# Patient Record
Sex: Female | Born: 1940 | Race: White | Hispanic: No | Marital: Married | Smoking: Former smoker
Health system: Southern US, Community
[De-identification: ages and names within clinical notes are randomized; demographics above are authoritative.]

## PROBLEM LIST (undated history)

## (undated) DIAGNOSIS — R0603 Acute respiratory distress: Secondary | ICD-10-CM

## (undated) DIAGNOSIS — I341 Nonrheumatic mitral (valve) prolapse: Secondary | ICD-10-CM

## (undated) DIAGNOSIS — N6019 Diffuse cystic mastopathy of unspecified breast: Secondary | ICD-10-CM

## (undated) DIAGNOSIS — IMO0001 Reserved for inherently not codable concepts without codable children: Secondary | ICD-10-CM

## (undated) DIAGNOSIS — M1990 Unspecified osteoarthritis, unspecified site: Secondary | ICD-10-CM

## (undated) DIAGNOSIS — E7800 Pure hypercholesterolemia, unspecified: Secondary | ICD-10-CM

## (undated) DIAGNOSIS — R001 Bradycardia, unspecified: Secondary | ICD-10-CM

## (undated) DIAGNOSIS — I4510 Unspecified right bundle-branch block: Secondary | ICD-10-CM

## (undated) DIAGNOSIS — G20 Parkinson's disease: Secondary | ICD-10-CM

## (undated) DIAGNOSIS — R1310 Dysphagia, unspecified: Secondary | ICD-10-CM

## (undated) DIAGNOSIS — R030 Elevated blood-pressure reading, without diagnosis of hypertension: Secondary | ICD-10-CM

## (undated) DIAGNOSIS — K219 Gastro-esophageal reflux disease without esophagitis: Secondary | ICD-10-CM

## (undated) DIAGNOSIS — F0390 Unspecified dementia without behavioral disturbance: Secondary | ICD-10-CM

## (undated) HISTORY — PX: DILATION AND CURETTAGE OF UTERUS: SHX78

## (undated) HISTORY — DX: Gastro-esophageal reflux disease without esophagitis: K21.9

## (undated) HISTORY — DX: Reserved for inherently not codable concepts without codable children: IMO0001

## (undated) HISTORY — DX: Nonrheumatic mitral (valve) prolapse: I34.1

## (undated) HISTORY — DX: Parkinson's disease: G20

## (undated) HISTORY — DX: Bradycardia, unspecified: R00.1

## (undated) HISTORY — DX: Dysphagia, unspecified: R13.10

## (undated) HISTORY — DX: Pure hypercholesterolemia, unspecified: E78.00

## (undated) HISTORY — DX: Acute respiratory distress: R06.03

## (undated) HISTORY — DX: Diffuse cystic mastopathy of unspecified breast: N60.19

## (undated) HISTORY — DX: Unspecified right bundle-branch block: I45.10

## (undated) HISTORY — PX: TONSILLECTOMY: SUR1361

## (undated) HISTORY — DX: Elevated blood-pressure reading, without diagnosis of hypertension: R03.0

## (undated) HISTORY — DX: Unspecified osteoarthritis, unspecified site: M19.90

---

## 1998-04-23 ENCOUNTER — Ambulatory Visit (HOSPITAL_COMMUNITY): Admission: RE | Admit: 1998-04-23 | Discharge: 1998-04-23 | Payer: Self-pay | Admitting: Obstetrics & Gynecology

## 1999-12-11 ENCOUNTER — Other Ambulatory Visit: Admission: RE | Admit: 1999-12-11 | Discharge: 1999-12-11 | Payer: Self-pay | Admitting: Obstetrics and Gynecology

## 2000-01-06 HISTORY — PX: COLONOSCOPY: SHX174

## 2000-07-20 ENCOUNTER — Encounter (INDEPENDENT_AMBULATORY_CARE_PROVIDER_SITE_OTHER): Payer: Self-pay | Admitting: Specialist

## 2000-07-20 ENCOUNTER — Ambulatory Visit (HOSPITAL_COMMUNITY): Admission: RE | Admit: 2000-07-20 | Discharge: 2000-07-20 | Payer: Self-pay | Admitting: Obstetrics and Gynecology

## 2000-08-17 ENCOUNTER — Encounter: Admission: RE | Admit: 2000-08-17 | Discharge: 2000-08-17 | Payer: Self-pay | Admitting: Obstetrics and Gynecology

## 2000-08-17 ENCOUNTER — Encounter: Payer: Self-pay | Admitting: Obstetrics and Gynecology

## 2000-12-16 ENCOUNTER — Encounter: Payer: Self-pay | Admitting: Internal Medicine

## 2000-12-21 ENCOUNTER — Other Ambulatory Visit: Admission: RE | Admit: 2000-12-21 | Discharge: 2000-12-21 | Payer: Self-pay | Admitting: Obstetrics and Gynecology

## 2001-12-23 ENCOUNTER — Other Ambulatory Visit: Admission: RE | Admit: 2001-12-23 | Discharge: 2001-12-23 | Payer: Self-pay | Admitting: Obstetrics and Gynecology

## 2002-10-19 ENCOUNTER — Encounter: Payer: Self-pay | Admitting: Internal Medicine

## 2002-12-26 ENCOUNTER — Other Ambulatory Visit: Admission: RE | Admit: 2002-12-26 | Discharge: 2002-12-26 | Payer: Self-pay | Admitting: Obstetrics and Gynecology

## 2003-11-14 ENCOUNTER — Ambulatory Visit: Payer: Self-pay | Admitting: Internal Medicine

## 2004-01-01 ENCOUNTER — Other Ambulatory Visit: Admission: RE | Admit: 2004-01-01 | Discharge: 2004-01-01 | Payer: Self-pay | Admitting: Obstetrics and Gynecology

## 2004-10-24 ENCOUNTER — Ambulatory Visit: Payer: Self-pay | Admitting: Internal Medicine

## 2004-11-06 ENCOUNTER — Encounter: Admission: RE | Admit: 2004-11-06 | Discharge: 2004-11-06 | Payer: Self-pay | Admitting: Obstetrics and Gynecology

## 2004-11-07 ENCOUNTER — Ambulatory Visit: Payer: Self-pay | Admitting: Internal Medicine

## 2005-01-13 ENCOUNTER — Other Ambulatory Visit: Admission: RE | Admit: 2005-01-13 | Discharge: 2005-01-13 | Payer: Self-pay | Admitting: Obstetrics and Gynecology

## 2005-10-07 ENCOUNTER — Ambulatory Visit: Payer: Self-pay | Admitting: Internal Medicine

## 2005-11-03 ENCOUNTER — Ambulatory Visit: Payer: Self-pay | Admitting: Internal Medicine

## 2005-11-11 ENCOUNTER — Ambulatory Visit: Payer: Self-pay | Admitting: Internal Medicine

## 2006-05-12 ENCOUNTER — Ambulatory Visit: Payer: Self-pay | Admitting: Internal Medicine

## 2006-05-12 LAB — CONVERTED CEMR LAB
Cholesterol: 215 mg/dL (ref 0–200)
Direct LDL: 114.4 mg/dL
HDL: 70.4 mg/dL (ref >39.0)
Hgb A1c MFr Bld: 5.1 % (ref 4.6–6.0)
Total CHOL/HDL Ratio: 3.1
Triglycerides: 81 mg/dL (ref 0–149)
VLDL: 16 mg/dL (ref 0–40)

## 2006-05-24 ENCOUNTER — Encounter: Payer: Self-pay | Admitting: Internal Medicine

## 2006-05-24 ENCOUNTER — Ambulatory Visit: Payer: Self-pay | Admitting: Internal Medicine

## 2006-11-15 ENCOUNTER — Telehealth (INDEPENDENT_AMBULATORY_CARE_PROVIDER_SITE_OTHER): Payer: Self-pay | Admitting: *Deleted

## 2007-01-27 ENCOUNTER — Telehealth (INDEPENDENT_AMBULATORY_CARE_PROVIDER_SITE_OTHER): Payer: Self-pay | Admitting: *Deleted

## 2007-01-28 ENCOUNTER — Telehealth (INDEPENDENT_AMBULATORY_CARE_PROVIDER_SITE_OTHER): Payer: Self-pay | Admitting: *Deleted

## 2007-02-02 ENCOUNTER — Encounter: Payer: Self-pay | Admitting: Internal Medicine

## 2007-02-07 ENCOUNTER — Other Ambulatory Visit: Admission: RE | Admit: 2007-02-07 | Discharge: 2007-02-07 | Payer: Self-pay | Admitting: Obstetrics and Gynecology

## 2007-03-03 ENCOUNTER — Encounter: Payer: Self-pay | Admitting: Internal Medicine

## 2007-04-25 ENCOUNTER — Encounter: Payer: Self-pay | Admitting: Internal Medicine

## 2007-04-27 ENCOUNTER — Encounter: Payer: Self-pay | Admitting: Internal Medicine

## 2007-05-06 ENCOUNTER — Encounter (INDEPENDENT_AMBULATORY_CARE_PROVIDER_SITE_OTHER): Payer: Self-pay | Admitting: *Deleted

## 2007-05-23 ENCOUNTER — Telehealth (INDEPENDENT_AMBULATORY_CARE_PROVIDER_SITE_OTHER): Payer: Self-pay | Admitting: *Deleted

## 2007-06-15 ENCOUNTER — Ambulatory Visit: Payer: Self-pay | Admitting: Internal Medicine

## 2007-07-12 ENCOUNTER — Encounter: Payer: Self-pay | Admitting: Internal Medicine

## 2007-09-29 ENCOUNTER — Ambulatory Visit: Payer: Self-pay | Admitting: Internal Medicine

## 2008-02-29 ENCOUNTER — Encounter: Payer: Self-pay | Admitting: Internal Medicine

## 2008-06-14 ENCOUNTER — Ambulatory Visit: Payer: Self-pay | Admitting: Internal Medicine

## 2008-07-02 ENCOUNTER — Encounter: Payer: Self-pay | Admitting: Internal Medicine

## 2008-07-13 ENCOUNTER — Encounter: Payer: Self-pay | Admitting: Internal Medicine

## 2008-10-10 ENCOUNTER — Encounter: Payer: Self-pay | Admitting: Internal Medicine

## 2009-06-11 ENCOUNTER — Encounter: Payer: Self-pay | Admitting: Internal Medicine

## 2009-06-14 ENCOUNTER — Ambulatory Visit: Payer: Self-pay | Admitting: Internal Medicine

## 2009-06-26 ENCOUNTER — Encounter: Payer: Self-pay | Admitting: Internal Medicine

## 2009-07-02 ENCOUNTER — Ambulatory Visit: Payer: Self-pay | Admitting: Internal Medicine

## 2009-07-02 LAB — CONVERTED CEMR LAB
OCCULT 1: NEGATIVE
OCCULT 2: NEGATIVE
OCCULT 3: NEGATIVE

## 2009-07-03 ENCOUNTER — Encounter (INDEPENDENT_AMBULATORY_CARE_PROVIDER_SITE_OTHER): Payer: Self-pay | Admitting: *Deleted

## 2009-07-15 ENCOUNTER — Encounter: Payer: Self-pay | Admitting: Internal Medicine

## 2009-10-28 ENCOUNTER — Encounter: Payer: Self-pay | Admitting: Internal Medicine

## 2010-02-02 LAB — CONVERTED CEMR LAB
ALT: 6 units/L (ref 0–35)
ALT: 6 units/L (ref 0–35)
ALT: 7 units/L (ref 0–35)
AST: 19 units/L (ref 0–37)
AST: 22 units/L (ref 0–37)
AST: 22 units/L (ref 0–37)
Albumin: 3.8 g/dL (ref 3.5–5.2)
Albumin: 3.9 g/dL (ref 3.5–5.2)
Albumin: 4 g/dL (ref 3.5–5.2)
Alkaline Phosphatase: 55 units/L (ref 39–117)
Alkaline Phosphatase: 56 units/L (ref 39–117)
Alkaline Phosphatase: 56 units/L (ref 39–117)
BUN: 20 mg/dL (ref 6–23)
BUN: 21 mg/dL (ref 6–23)
BUN: 21 mg/dL (ref 6–23)
Basophils Absolute: 0 10*3/uL (ref 0.0–0.1)
Basophils Absolute: 0 10*3/uL (ref 0.0–0.1)
Basophils Absolute: 0 10*3/uL (ref 0.0–0.1)
Basophils Relative: 0 % (ref 0.0–1.0)
Basophils Relative: 0.4 % (ref 0.0–3.0)
Basophils Relative: 0.5 % (ref 0.0–3.0)
Bilirubin Urine: NEGATIVE
Bilirubin, Direct: 0.1 mg/dL (ref 0.0–0.3)
Bilirubin, Direct: 0.1 mg/dL (ref 0.0–0.3)
Bilirubin, Direct: 0.1 mg/dL (ref 0.0–0.3)
Blood in Urine, dipstick: NEGATIVE
CO2: 27 meq/L (ref 19–32)
CO2: 28 meq/L (ref 19–32)
CO2: 30 meq/L (ref 19–32)
Calcium: 9.2 mg/dL (ref 8.4–10.5)
Calcium: 9.3 mg/dL (ref 8.4–10.5)
Calcium: 9.7 mg/dL (ref 8.4–10.5)
Chloride: 104 meq/L (ref 96–112)
Chloride: 105 meq/L (ref 96–112)
Chloride: 109 meq/L (ref 96–112)
Cholesterol, target level: 200 mg/dL
Cholesterol: 193 mg/dL (ref 0–200)
Cholesterol: 199 mg/dL (ref 0–200)
Cholesterol: 210 mg/dL — ABNORMAL HIGH (ref 0–200)
Creatinine, Ser: 0.8 mg/dL (ref 0.4–1.2)
Creatinine, Ser: 0.8 mg/dL (ref 0.4–1.2)
Creatinine, Ser: 0.9 mg/dL (ref 0.4–1.2)
Direct LDL: 116.7 mg/dL
Eosinophils Absolute: 0.2 10*3/uL (ref 0.0–0.7)
Eosinophils Absolute: 0.3 10*3/uL (ref 0.0–0.7)
Eosinophils Absolute: 0.6 10*3/uL (ref 0.0–0.7)
Eosinophils Relative: 3.1 % (ref 0.0–5.0)
Eosinophils Relative: 3.9 % (ref 0.0–5.0)
Eosinophils Relative: 8.5 % — ABNORMAL HIGH (ref 0.0–5.0)
GFR calc Af Amer: 92 mL/min
GFR calc non Af Amer: 66.11 mL/min (ref >60)
GFR calc non Af Amer: 75.51 mL/min (ref >60)
GFR calc non Af Amer: 76 mL/min
Glucose, Bld: 86 mg/dL (ref 70–99)
Glucose, Bld: 89 mg/dL (ref 70–99)
Glucose, Bld: 93 mg/dL (ref 70–99)
Glucose, Urine, Semiquant: NEGATIVE
HCT: 37.3 % (ref 36.0–46.0)
HCT: 39.8 % (ref 36.0–46.0)
HCT: 39.8 % (ref 36.0–46.0)
HDL goal, serum: 50 mg/dL
HDL: 62.4 mg/dL (ref >39.0)
HDL: 63.6 mg/dL (ref >39.00)
HDL: 69.6 mg/dL (ref >39.00)
Hemoglobin: 12.8 g/dL (ref 12.0–15.0)
Hemoglobin: 13.5 g/dL (ref 12.0–15.0)
Hemoglobin: 13.8 g/dL (ref 12.0–15.0)
LDL Cholesterol: 113 mg/dL — ABNORMAL HIGH (ref 0–99)
LDL Cholesterol: 114 mg/dL — ABNORMAL HIGH (ref 0–99)
LDL Goal: 120 mg/dL
Lymphocytes Relative: 38.1 % (ref 12.0–46.0)
Lymphocytes Relative: 42.8 % (ref 12.0–46.0)
Lymphocytes Relative: 45 % (ref 12.0–46.0)
Lymphs Abs: 2.8 10*3/uL (ref 0.7–4.0)
Lymphs Abs: 3.6 10*3/uL (ref 0.7–4.0)
MCHC: 34 g/dL (ref 30.0–36.0)
MCHC: 34.2 g/dL (ref 30.0–36.0)
MCHC: 34.8 g/dL (ref 30.0–36.0)
MCV: 94.2 fL (ref 78.0–100.0)
MCV: 95.6 fL (ref 78.0–100.0)
MCV: 97.4 fL (ref 78.0–100.0)
Monocytes Absolute: 0.5 10*3/uL (ref 0.1–1.0)
Monocytes Absolute: 0.6 10*3/uL (ref 0.1–1.0)
Monocytes Absolute: 0.6 10*3/uL (ref 0.1–1.0)
Monocytes Relative: 6.8 % (ref 3.0–12.0)
Monocytes Relative: 8 % (ref 3.0–12.0)
Monocytes Relative: 9.1 % (ref 3.0–12.0)
Neutro Abs: 2.7 10*3/uL (ref 1.4–7.7)
Neutro Abs: 3.3 10*3/uL (ref 1.4–7.7)
Neutro Abs: 3.4 10*3/uL (ref 1.4–7.7)
Neutrophils Relative %: 42.7 % — ABNORMAL LOW (ref 43.0–77.0)
Neutrophils Relative %: 45 % (ref 43.0–77.0)
Neutrophils Relative %: 46.1 % (ref 43.0–77.0)
Nitrite: NEGATIVE
Platelets: 187 10*3/uL (ref 150.0–400.0)
Platelets: 189 10*3/uL (ref 150–400)
Platelets: 200 10*3/uL (ref 150.0–400.0)
Potassium: 4 meq/L (ref 3.5–5.1)
Potassium: 4.3 meq/L (ref 3.5–5.1)
Potassium: 4.7 meq/L (ref 3.5–5.1)
Protein, U semiquant: NEGATIVE
RBC: 3.83 M/uL — ABNORMAL LOW (ref 3.87–5.11)
RBC: 4.17 M/uL (ref 3.87–5.11)
RBC: 4.22 M/uL (ref 3.87–5.11)
RDW: 12.3 % (ref 11.5–14.6)
RDW: 12.4 % (ref 11.5–14.6)
RDW: 13.5 % (ref 11.5–14.6)
Sodium: 138 meq/L (ref 135–145)
Sodium: 141 meq/L (ref 135–145)
Sodium: 142 meq/L (ref 135–145)
Specific Gravity, Urine: 1.02
TSH: 0.5 microintl units/mL (ref 0.35–5.50)
TSH: 0.55 microintl units/mL (ref 0.35–5.50)
TSH: 0.63 microintl units/mL (ref 0.35–5.50)
Total Bilirubin: 1 mg/dL (ref 0.3–1.2)
Total Bilirubin: 1 mg/dL (ref 0.3–1.2)
Total Bilirubin: 1.1 mg/dL (ref 0.3–1.2)
Total CHOL/HDL Ratio: 3
Total CHOL/HDL Ratio: 3
Total CHOL/HDL Ratio: 3.1
Total Protein: 6.4 g/dL (ref 6.0–8.3)
Total Protein: 6.4 g/dL (ref 6.0–8.3)
Total Protein: 6.7 g/dL (ref 6.0–8.3)
Triglycerides: 101 mg/dL (ref 0.0–149.0)
Triglycerides: 105 mg/dL (ref 0.0–149.0)
Triglycerides: 90 mg/dL (ref 0–149)
Urobilinogen, UA: NEGATIVE
VLDL: 18 mg/dL (ref 0–40)
VLDL: 20.2 mg/dL (ref 0.0–40.0)
VLDL: 21 mg/dL (ref 0.0–40.0)
WBC Urine, dipstick: NEGATIVE
WBC: 6.2 10*3/uL (ref 4.5–10.5)
WBC: 7.4 10*3/uL (ref 4.5–10.5)
WBC: 7.8 10*3/uL (ref 4.5–10.5)
pH: 5

## 2010-02-04 NOTE — Letter (Signed)
Summary: [facility]   Imported By: Lanelle Bal 06/27/2009 09:35:45  _____________________________________________________________________  External Attachment:    Type:   Image     Comment:   External Document

## 2010-02-04 NOTE — Assessment & Plan Note (Signed)
Summary: cpx//fd   Vital Signs:  Patient profile:   70 year old female Height:      63.5 inches Weight:      130.6 pounds BMI:     22.85 Temp:     97.7 degrees F oral Pulse rate:   64 / minute Resp:     14 per minute BP sitting:   118 / 70  (left arm) Cuff size:   large  Vitals Entered By: Shonna Chock (June 14, 2009 8:22 AM) CC: CPX with fasting labs , Lipid Management Comments REVIEWED MED LIST, PATIENT AGREED DOSE AND INSTRUCTION CORRECT    CC:  CPX with fasting labs  and Lipid Management.  History of Present Illness: Here for Medicare AWV:  1.   Risk factors based on Past M, S, F history:HTN, Lipids(see Panels),bradycardia, GERD 2.   Physical Activities: see entries 3.   Depression/mood:: minor , intermittent, meds declined 4.   Hearing: none, whisper heard @ 6 feet 5.   ADL's: no limitations despite Parkinson's 6.   Fall Risk: none despite Parkinson's, meds Rxed by Dr Lorin Picket ,[facility] 7.   Home Safety: no risks identified 8.   Height, weight, &visual acuity:vision normal with glasses, seen by Ophth 6 mos ago 9.   Counseling: no acute needs 10.   Labs ordered based on risk factors: 401.9, 995.20,272.4 11.           Referral Coordination: none 12.           Care Plan:Parkinson's  as per Dr Lorin Picket; D/C Beta blocker due to low BP & braycardia. Avoid NSAIDS ASAP due to GERD 13.            Cognitive Assessment: Oriented X3   Lipid Management History:      Positive NCEP/ATP III risk factors include female age 35 years old or older and hypertension.  Negative NCEP/ATP III risk factors include no history of early menopause without estrogen hormone replacement, non-diabetic, HDL cholesterol greater than 60, no family history for ischemic heart disease, non-tobacco-user status, no ASHD (atherosclerotic heart disease), no prior stroke/TIA, no peripheral vascular disease, and no history of aortic aneurysm.     Preventive Screening-Counseling & Management  Alcohol-Tobacco     Alcohol  drinks/day: 1     Smoking Status: quit > 6 months     Year Quit: 1997  Caffeine-Diet-Exercise     Caffeine use/day: 1 cup /day     Diet Comments: none     Does Patient Exercise: yes     Type of exercise: swimming, [employer]     Exercise (avg: min/session): 30-60  Hep-HIV-STD-Contraception     Dental Visit-last 6 months yes     SBE monthly: no     Sun Exposure-Excessive: no  Safety-Violence-Falls     Seat Belt Use: yes     Smoke Detectors: yes     Violence in the Home: no risk noted     Sexual Abuse: no     Fall Risk: Parkinson's      Sexual History:  currently monogamous.        Drug Use:  never.        Blood Transfusions:  no.        Travel History:  none recently.    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 Hypercholestrolemia: LDL goal < 120 as per NMR Lipoprofile & < 160  as per Framingham Study  Parkinson's, Dr Jerold Coombe ,[facility] GERD  Past Surgical History: G3 P2; D&C X1; Tonsillectomy Colonoscopy negative X 2 (last 2009, due 2019)  Family History: Father: CVA,HTN,CHF, asthma; DM 1st cousin Mother: CVA,HTN,breast cancer Siblings: negative  (twin brother)  Social History: No diet  Former Smoker: quit 1997 Alcohol use-yes:socially Regular exercise-yes: Exercise class 2X/week ; swim 3X/week Smoking Status:  quit > 6 months Caffeine use/day:  1 cup /day Dental Care w/in 6 mos.:  yes Sun Exposure-Excessive:  no Seat Belt Use:  yes Fall Risk:  Parkinson's Sexual History:  currently monogamous Drug Use:  never Blood Transfusions:  no  Review of Systems  The patient denies fever, vision loss, decreased hearing, hoarseness, prolonged cough, headaches, hemoptysis, abdominal pain, melena, hematochezia, severe indigestion/heartburn, hematuria, suspicious skin lesions, unusual weight change, abnormal bleeding, enlarged lymph nodes, angioedema, and  breast masses.         Minimally decreased appetite due to Parkinson's & meds. Weight down 4# over  past year. Gyn seen 2011, vitamin D Rxed.Minor incontinence. Occasional sharp pain in legs w/o trigger.OTC Prevacid  as needed  controls dyspepsia.NSAIDS as needed OA pain, up to 4?day. CV:  Denies chest pain or discomfort, difficulty breathing at night, difficulty breathing while lying down, leg cramps with exertion, lightheadness, near fainting, shortness of breath with exertion, swelling of feet, and swelling of hands; Rare palpitations.  Physical Exam  General:  well-nourished,in no acute distress; alert,appropriate and cooperative throughout examination Head:  Normocephalic and atraumatic without obvious abnormalities.  Eyes:  No corneal or conjunctival inflammation noted. Perrla. Funduscopic exam benign, without hemorrhages, exudates or papilledema. Vision grossly normal. Ears:  External ear exam shows no significant lesions or deformities.  Otoscopic examination reveals clear canals, tympanic membranes are intact bilaterally without bulging, retraction, inflammation or discharge. Hearing is grossly normal bilaterally. Nose:  External nasal examination shows no deformity or inflammation. Nasal mucosa are pink and moist without lesions or exudates. Mouth:  Oral mucosa and oropharynx without lesions or exudates.  Teeth in good repair. Neck:  No deformities, masses, or tenderness noted. Breasts:  [place] [facility] Lungs:  Normal respiratory effort, chest expands symmetrically. Lungs are clear to auscultation, no crackles or wheezes. Heart:  regular rhythm, no murmur, no gallop, no rub, no JVD, no HJR,  split S1,physiological split S2, and bradycardia.   Abdomen:  Bowel sounds positive,abdomen soft and non-tender without masses, organomegaly or hernias noted. Genitalia:  [place] [facility] Msk:  No deformity or scoliosis noted of thoracic or lumbar spine.   Pulses:  R and L carotid,radial,dorsalis pedis and posterior  tibial pulses are full and equal bilaterally Extremities:  No clubbing, cyanosis, edema, or deformity noted with normal full range of motion of all joints.   No tremor or cogwheeling Neurologic:  alert & oriented X3, strength normal in all extremities, and DTRs symmetrical and normal except decreased R knee.   Skin:  Intact without suspicious lesions or rashes Cervical Nodes:  No lymphadenopathy noted Axillary Nodes:  No palpable lymphadenopathy Psych:  memory intact for recent and remote, normally interactive, good eye contact, not anxious appearing, and not depressed appearing.     Impression & Recommendations:  Problem # 1:  PREVENTIVE HEALTH CARE (ICD-V70.0)  Orders: Subsequent annual wellness visit with prevention plan (O1308) TLB-CBC Platelet - w/Differential (85025-CBCD)  Problem # 2:  PARKINSON'S DISEASE (ICD-332.0)  as per Dr Lorin Picket  Orders: Venipuncture (65784)  Problem # 3:  HYPERCHOLESTEROLEMIA, PURE (ICD-272.0)  Orders: Venipuncture (69629) TLB-Lipid  Panel (80061-LIPID) TLB-Hepatic/Liver Function Pnl (80076-HEPATIC)  Problem # 4:  BRADYCARDIA (ICD-427.89)  The following medications were removed from the medication list:    Metoprolol Tartrate 25 Mg Tabs (Metoprolol tartrate) .Marland Kitchen... 1 by mouth once daily as needed for bp Her updated medication list for this problem includes:    Aspirin 81 Mg Tbec (Aspirin) .Marland Kitchen... Take one tablet daily  Orders: Venipuncture (60454) TLB-TSH (Thyroid Stimulating Hormone) (84443-TSH) EKG w/ Interpretation (93000)  Problem # 5:  ELEVATED BLOOD PRESSURE WITHOUT DIAGNOSIS OF HYPERTENSION (ICD-796.2)  The following medications were removed from the medication list:    Metoprolol Tartrate 25 Mg Tabs (Metoprolol tartrate) .Marland Kitchen... 1 by mouth once daily as needed for bp  Orders: Venipuncture (09811) TLB-BMP (Basic Metabolic Panel-BMET) (80048-METABOL) EKG w/ Interpretation (93000)  Problem # 6:  GERD (ICD-530.81)  Orders: TLB-CBC  Platelet - w/Differential (85025-CBCD)  Problem # 7:  DEGENERATIVE JOINT DISEASE, GENERALIZED (ICD-715.00)  Her updated medication list for this problem includes:    Aspirin 81 Mg Tbec (Aspirin) .Marland Kitchen... Take one tablet daily  Complete Medication List: 1)  Ropinirole Hcl 1 Mg Tabs (Ropinirole hcl) .... 3 tabs daily 2)  Selegiline Hcl 5 Mg Caps (Selegiline hcl) .... One tablet daily 3)  Sinemet 10-100 Mg Tabs (Carbidopa-levodopa) .... One half tablet twice daily 4)  Omega-3 350 Mg Caps (Omega-3 fatty acids) 5)  Aspirin 81 Mg Tbec (Aspirin) .... Take one tablet daily 6)  Vitamin D (ergocalciferol) 50000 Unit Caps (Ergocalciferol) .Marland Kitchen.. 1 by mouth every other week  Other Orders: UA Dipstick w/o Micro (manual) (91478)  Lipid Assessment/Plan:      Based on NCEP/ATP III, the patient's risk factor category is "0-1 risk factors".  The patient's lipid goals are as follows: Total cholesterol goal is 200; LDL cholesterol goal is 120; HDL cholesterol goal is 50; Triglyceride goal is 150.  Her LDL cholesterol goal has been met.    Patient Instructions: 1)  Stop Metoprolol due to low BP & bradycardia.Check your Blood Pressure regularly. If it is above: 140/90 ON AVERAGE off Metoprolol  you should make an appointment.Take antibiotics before any dental, gastrointestinal, or genitourinary procedures to prevent damage to your heart valves from an infection. Avoid foods high in acid (tomatoes, citrus juices, spicy foods). Avoid eating within two hours of lying down or before exercising. Do not over eat; try smaller more frequent meals. Elevate head of bed twelve inches when sleeping. Avoid NSAIDS as discussed.    Laboratory Results   Urine Tests   Date/Time Reported: June 14, 2009 9:51 AM   Routine Urinalysis   Color: yellow Appearance: Clear Glucose: negative   (Normal Range: Negative) Bilirubin: negative   (Normal Range: Negative) Ketone: smal (15)   (Normal Range: Negative) Spec. Gravity: 1.020    (Normal Range: 1.003-1.035) Blood: negative   (Normal Range: Negative) pH: 5.0   (Normal Range: 5.0-8.0) Protein: negative   (Normal Range: Negative) Urobilinogen: negative   (Normal Range: 0-1) Nitrite: negative   (Normal Range: Negative) Leukocyte Esterace: negative   (Normal Range: Negative)    Comments: Floydene Flock  June 14, 2009 9:51 AM

## 2010-02-04 NOTE — Letter (Signed)
Summary: Results Follow up Letter  [facility] [place]  [address] [place], [place] [address]   Phone: [phone]  Fax: [phone]    07/03/2009 MRN: [id number]  [facility] [place], [place]  [address]  Dear Melissa Pollard,  The following are the results of your recent test(s):  Test         Result    Pap Smear:        Normal _____  Not Normal _____ Comments: ______________________________________________________ Cholesterol: LDL(Bad cholesterol):         Your goal is less than:         HDL (Good cholesterol):       Your goal is more than: Comments:  ______________________________________________________ Mammogram:        Normal _____  Not Normal _____ Comments:  ___________________________________________________________________ Hemoccult:        Normal __X___  Not normal _______ Comments:    _____________________________________________________________________ Other Tests:    We routinely do not discuss normal results over the telephone.  If you desire a copy of the results, or you have any questions about this information we can discuss them at your next office visit.   Sincerely,

## 2010-02-04 NOTE — Miscellaneous (Signed)
Summary: [facility]  [facility]   Imported By: Lanelle Bal 11/06/2009 11:51:49  _____________________________________________________________________  External Attachment:    Type:   Image     Comment:   External Document

## 2010-02-06 ENCOUNTER — Encounter: Payer: Self-pay | Admitting: Internal Medicine

## 2010-03-04 NOTE — Letter (Signed)
Summary: [facility]  [facility]   Imported By: Maryln Gottron 02/27/2010 09:05:16  _____________________________________________________________________  External Attachment:    Type:   Image     Comment:   External Document

## 2010-05-23 NOTE — H&P (Signed)
[facility]  Patient:    Melissa Pollard, Melissa Pollard                        MRN: [id number] Attending:  Esmeralda Arthur, M.D.                         History and Physical  HISTORY:                      This is a 70 year old female, para 3-1-2 who is admitted to the hospital for [facility] hysteroscopy for an abnormal ultrasound.  She had an ultrasound done on July 15, 2000 and was found to have a retroverted uterus with an endometrial line at 22 mm.  She had linear cystic areas and multiple large echo free areas and calcium.  Her right ovary measured 2.3 x 1.5 x 2.1.  The left ovary was 1.7 x 1.3 x 1.1.  I tried a sonohystogram, and I was unable to visualize the cervix.  I could not do a D&C in the office.  She called 24 hours later and stated that she was bleeding heavy, and has continued to have heavy bleeding all weekend.  Her labwork this morning revealed hemoglobin 14.8.  I told her that I was not surprised that she had heavy bleeding with a uterus as full as hers was.  MEDICATIONS:                  She has been taking Ortho-Prefest for approximately seven months.  She came off of it for a while and then started it again.  She takes Viactiv calcium tablets.  ALLERGIES:                    None known.  PAST GYNECOLOGIC HISTORY:     Her last Pap smear was in December 2001 and was normal.  HABITS:                       She uses caffeine.  She socially has alcohol. She exercises regularly.  REVIEW OF SYSTEMS:            Basically negative.  She has had a slight weight gain.  She does have some stress urinary incontinence and urgency.  She has arthritis she treats.  FAMILY HISTORY:               Her mother had breast cancer.  There is no history of colon or ovarian cancer.  No history of heart disease, strokes or diabetes in the family.  PHYSICAL EXAMINATION:  GENERAL:                      Well-developed, well-nourished female, alert and oriented.  She is  somewhat anxious about heavy bleeding.  VITAL SIGNS:                  Blood pressure 110/70.  Weight 155 today. Height 5 ft 5 in.  NECK:                         Thyroid not palpable.  LUNGS:                        Clear to auscultation and percussion.  HEART:  Normal sinus rhythm.  BREASTS;                      Type 4 without masses.  ABDOMEN:                      Liver and spleen not palpated.  PELVIC:                       External genitalia negative.  Her cervix is not visualized.  Her uterus is posterior.  I was unable to bring the cervix anterior.  The uterus feels slightly enlarged.  On ultrasound, it is 9.6 x 5.1 x 6.5.  Adnexa reveal no masses.  She is having heavy bleeding.  IMPRESSION:                   1. Menorrhagia.                               2. Heavy uterine bleeding.                               3. Postmenopausal.                               4. Abnormal endometrial echos on ultrasound.                               5. Hormone replacement therapy off and on.  DISPOSITION:                  She is admitted for [facility] and hysteroscopy. DD:  07/19/00 TD:  07/19/00 Job: [id number]

## 2010-05-23 NOTE — Op Note (Signed)
[facility]  Patient:    Melissa Pollard, Melissa Pollard                      MRN: [id number] Proc. Date: 07/22/00 Adm. Date:  [id number] Attending:  Amanda Cockayne                           Operative Report  PREOPERATIVE DIAGNOSIS:         Postmenopausal bleeding, heavy.  POSTOPERATIVE DIAGNOSIS:        Endometriosis cast of the uterus and blood clot passed.  Curettement and perforation of the uterus posterior with passage of resectoscope.  OPERATION:  SURGEON:  Darci Needle, M.D.  DESCRIPTION OF PROCEDURE:       The patient was carried to the operating room. After satisfactory general anesthesia, the patient was placed in the lithotomy position, prepped and draped in a sterile field.  The bladder was emptied with catheterization which was very full.  The uterus felt mid posterior.  You could feel something in the vagina.  A weighted speculum was placed in the posterior vagina and it looked like a large clot in the cervix and we removed this with the Randall stone grasping forceps.  It look like a complete cast to the uterus.  This was sent to pathology.  The cervix was grasped with Christella Hartigan tenaculum and a 35 Hanks dilator had no resistance.  We then used the hysteroscope and looked into the uterus and washed the blood out.  She had no bleeding now and she had no large polyps, no fibroid.  We withdrew the scope and did a curettement with a moderate amount of tissue obtained.  We reinserted the scope and she had a lot of glandular tissue posterior and I decided to resect this to see what it looked like.  We did and got a nice piece.  We looked again and we had perforated the uterus.  We immediately stopped and checked her for bleeding.  She had none.  The procedure was terminated after observing she had no bleeding.  The patient was carried to the recovery room in good condition.  The deficit was 0 cc. DD:  07/20/00 TD:  07/21/00 Job:  [id number]

## 2010-06-14 ENCOUNTER — Encounter: Payer: Self-pay | Admitting: Internal Medicine

## 2010-06-16 ENCOUNTER — Ambulatory Visit (INDEPENDENT_AMBULATORY_CARE_PROVIDER_SITE_OTHER): Payer: Medicare Other | Admitting: Internal Medicine

## 2010-06-16 ENCOUNTER — Encounter: Payer: Self-pay | Admitting: Internal Medicine

## 2010-06-16 VITALS — BP 122/78 | HR 60 | Temp 98.1°F | Ht 63.25 in | Wt 119.0 lb

## 2010-06-16 DIAGNOSIS — Z136 Encounter for screening for cardiovascular disorders: Secondary | ICD-10-CM

## 2010-06-16 DIAGNOSIS — Z0000 Encounter for general adult medical examination without abnormal findings: Secondary | ICD-10-CM

## 2010-06-16 DIAGNOSIS — E7800 Pure hypercholesterolemia, unspecified: Secondary | ICD-10-CM

## 2010-06-16 DIAGNOSIS — G20A1 Parkinson's disease without dyskinesia, without mention of fluctuations: Secondary | ICD-10-CM

## 2010-06-16 DIAGNOSIS — F411 Generalized anxiety disorder: Secondary | ICD-10-CM

## 2010-06-16 DIAGNOSIS — G20 Parkinson's disease: Secondary | ICD-10-CM

## 2010-06-16 DIAGNOSIS — R030 Elevated blood-pressure reading, without diagnosis of hypertension: Secondary | ICD-10-CM

## 2010-06-16 LAB — BASIC METABOLIC PANEL
BUN: 21 mg/dL (ref 6–23)
Calcium: 9.5 mg/dL (ref 8.4–10.5)
Creatinine, Ser: 1.1 mg/dL (ref 0.4–1.2)
GFR: 53.83 mL/min — ABNORMAL LOW (ref >60.00)
Glucose, Bld: 94 mg/dL (ref 70–99)
Sodium: 139 mEq/L (ref 135–145)

## 2010-06-16 LAB — HEPATIC FUNCTION PANEL
ALT: 8 U/L (ref 0–35)
AST: 24 U/L (ref 0–37)
Alkaline Phosphatase: 59 U/L (ref 39–117)
Bilirubin, Direct: 0.2 mg/dL (ref 0.0–0.3)
Total Bilirubin: 1.2 mg/dL (ref 0.3–1.2)

## 2010-06-16 MED ORDER — SERTRALINE HCL 25 MG PO TABS: 25.0000 mg | ORAL_TABLET | Freq: Every day | ORAL | Status: DC

## 2010-06-16 NOTE — Progress Notes (Signed)
Subjective:    Patient ID: Melissa Pollard, female    DOB: January 31, 1940, 70 y.o.   MRN: [id number]  HPI Medicare Wellness Visit:  The following psychosocial & medical history were reviewed as required by Medicare.   Social history: caffeine: 1.5 cups/ day , alcohol:  1 wine ,  tobacco use : quit 1997 & exercise : 5X/ week as swimming .   Home & personal  safety / fall risk: Parkinson's , activities of daily living: no limitations , seatbelt use : yes , and smoke alarm employment : yes .  Power of Attorney/Living Will status : inplace  Vision ( as recorded per Nurse) & Hearing  evaluation :  Whisper heard  @ 6 ft; wall chart read @ 6 ft with lenses. Orientation :oriented x 3 , memory & recall :2 of 3, spelling or math testing:fair,and mood & affect :  normal . Depression / anxiety: both intermittently; sleeping poorly. Meds changed by Dr Lorin Picket Travel history : 2007 [place] , immunization status :up to date , transfusion history:  no, and preventive health surveillance ( colonoscopies, BMD , etc as per protocol/ [facility]): up to date, Dental care:  every 6 mos . Chart reviewed &  Updated. Active issues reviewed & addressed.       Review of Systems Anxiety:worse in past 4 weeks; she lost her twin brother who was an alcoholic & ? bipolar Depression:some Loss of interest (Anhedonia):no Panic attacks:no Insomnia:both with onset & maintenance of sleep Anorexia:no Fatigue:some Neurologic signs/symptoms: Headache, numbness and tingling, weakness:no Endocrinologic signs and symptoms: Hoarseness, significant weight change, vision change, temperature intolerance, bowel changes, skin/hair,/nail changes:no Family history of mental health issues, alcoholism or drug abuse:brother; father alcoholic Medications/efficacy:none to date        Objective:   Physical Exam Gen.: Healthy and well-nourished in appearance. Alert, appropriate and cooperative throughout exam. Subtle Parkinsonian facial changes Head:  Normocephalic without obvious abnormalities  Eyes: No corneal or conjunctival inflammation noted. ENT exam unremarkable. Neck: No deformities, masses, or tenderness noted. Range of motion &. Thyroid normal. Lungs: Normal respiratory effort; chest expands symmetrically. Lungs are clear to auscultation without rales, wheezes, or increased work of breathing. Heart: Normal rate and rhythm. Normal S1 and S2. No gallop, click, or rub. Grade 1 raspy R base  murmur. Abdomen: Bowel sounds normal; abdomen soft and nontender. No masses, organomegaly or hernias noted. Genitalia: as per Gyn.                                                                                      Musculoskeletal/extremities: asymmetry  noted of  the thoracic muscles. No clubbing, cyanosis, edema, or deformity noted. Range of motion  normal .Tone & strength  normal.Joints normal. Nail health  good. Vascular: Carotid, radial artery, dorsalis pedis and dorsalis posterior tibial pulses are full and equal. No bruits present. Neurologic: Alert and oriented x3. Deep tendon reflexes symmetrical and normal.          Skin: Intact without suspicious lesions or rashes. Lymph: No cervical, axillary, or inguinal lymphadenopathy present. Psych: Mood and affect are clinically  normal. Normally interactive  Assessment & Plan:  #1 Medicare Wellness Exam; criteria met ; data entered #2 Problem List reviewed ; Assessment/ Recommendations made #3 Anxiety/ Depression, situational Plan: see Orders

## 2010-06-16 NOTE — Assessment & Plan Note (Signed)
Plan: LDL  Goal = < 130.

## 2010-06-16 NOTE — Patient Instructions (Signed)
Preventive Health Care: Exercise  30-45  minutes a day, 3-4 days a week. Walking is especially valuable in preventing Osteoporosis. Eat a low-fat diet with lots of fruits and vegetables, up to 7-9 servings per day. Avoid obesity; your goal = waist less than 35 inches.Consume less than 30 grams of sugar per day from foods & drinks with High Fructose Corn Syrup as #2,3 or #4 on label. Assess response to Sertraline 25 mg daily.

## 2010-06-17 ENCOUNTER — Encounter: Payer: Self-pay | Admitting: Internal Medicine

## 2010-06-17 NOTE — Assessment & Plan Note (Signed)
As per Dr Jerold Coombe, [facility]

## 2010-07-04 ENCOUNTER — Telehealth: Payer: Self-pay | Admitting: *Deleted

## 2010-07-04 DIAGNOSIS — F411 Generalized anxiety disorder: Secondary | ICD-10-CM

## 2010-07-04 NOTE — Telephone Encounter (Signed)
This most  likely as this is a very tiny dose. The usual dose is 50 to 100 mg daily. Please increase the dose to 25 mg 2 pills taken once daily. At next refill the dosage can be changed to 50 mg.

## 2010-07-04 NOTE — Telephone Encounter (Signed)
Discuss with patient  

## 2010-07-04 NOTE — Telephone Encounter (Signed)
Pt called this morning and states that she feels that the Zoloft 25 mg is not helping her, she would like to increase her dosing. Please advise.

## 2010-07-08 ENCOUNTER — Other Ambulatory Visit: Payer: Self-pay | Admitting: Internal Medicine

## 2010-07-08 MED ORDER — SERTRALINE HCL 50 MG PO TABS: 50.0000 mg | ORAL_TABLET | Freq: Every day | ORAL | Status: DC

## 2010-07-08 NOTE — Telephone Encounter (Signed)
Patient needs refill for zoloft  50 mg [facility] [address]  [place] - see note [phone] for dose increase

## 2010-07-08 NOTE — Telephone Encounter (Signed)
Rx sent to pharmacy   

## 2010-07-28 ENCOUNTER — Encounter: Payer: Self-pay | Admitting: Internal Medicine

## 2010-10-15 ENCOUNTER — Ambulatory Visit (INDEPENDENT_AMBULATORY_CARE_PROVIDER_SITE_OTHER): Payer: Medicare Other

## 2010-10-15 DIAGNOSIS — Z23 Encounter for immunization: Secondary | ICD-10-CM

## 2011-02-02 ENCOUNTER — Ambulatory Visit (INDEPENDENT_AMBULATORY_CARE_PROVIDER_SITE_OTHER): Payer: Medicare Other | Admitting: Internal Medicine

## 2011-02-02 VITALS — BP 122/78 | HR 52 | Temp 98.1°F | Wt 113.2 lb

## 2011-02-02 DIAGNOSIS — F411 Generalized anxiety disorder: Secondary | ICD-10-CM

## 2011-02-02 DIAGNOSIS — I499 Cardiac arrhythmia, unspecified: Secondary | ICD-10-CM

## 2011-02-02 DIAGNOSIS — F419 Anxiety disorder, unspecified: Secondary | ICD-10-CM

## 2011-02-02 MED ORDER — SERTRALINE HCL 50 MG PO TABS: ORAL_TABLET | ORAL | Status: DC

## 2011-02-02 NOTE — Patient Instructions (Signed)
To prevent palpitations or premature beats, avoid stimulants such as decongestants, diet pills, nicotine, or caffeine (coffee, tea, cola, or chocolate) to excess.   

## 2011-02-02 NOTE — Progress Notes (Signed)
  Subjective:    Patient ID: Melissa Pollard, female    DOB: 17-Mar-1940, 71 y.o.   MRN: [id number]  HPI Mental Health Symptoms: Onset:significant for years Anxiety:"50% " improved with increased dose of Sertraline Depression:some ; "down in am" Loss of interest (Anhedonia):yes Panic attacks:slightly Insomnia:no Anorexia:poor Fatigue:some Neurologic signs/symptoms: No new  headache, numbness and tingling, weakness Endocrinologic signs and symptoms: Some hoarseness. No recent weight change or vision change. Intolerant to  temperature changes. No bowel changes, skin or nail changes. ? Hair loss       Review of Systems   She has situationally related palpitations; she denies chest pain     Objective:   Physical Exam  Gen.: Thin but well-nourished; in no acute distress Eyes: Extraocular motion intact; no lid lag or proptosis Neck : slight decrease in neck flexion; Thyroid normal Heart:irregular  rhythm and slow rate without significant murmur, gallop. Lungs: Chest clear to auscultation without rales,rales, wheezes Neuro:Deep tendon reflexes are equal but decreased @knees ; R hand tremor  Skin: Warm and dry without significant lesions or rashes; no onycholysis Psych: Normally communicative and interactive; no abnormal mood or affect clinically.         Assessment & Plan:    #1 Her anxiety has improved with an increased dose obstruction leading from 25 mg to 50 mg. This is still low dose. This will be increased to 50 mg 1-1/2 pills daily as a trial.  #2 she is having cardiac irregularity clinically; EKG will be completed. The EKG in June/12 revealed only right bundle branch block. Electrolytes and thyroid function will also be checked.  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 She has no premature beats despite the auscultory  findings on exam.

## 2011-06-10 ENCOUNTER — Encounter: Payer: Self-pay | Admitting: Internal Medicine

## 2011-06-10 ENCOUNTER — Ambulatory Visit (INDEPENDENT_AMBULATORY_CARE_PROVIDER_SITE_OTHER): Payer: Medicare Other | Admitting: Internal Medicine

## 2011-06-10 VITALS — BP 124/84 | HR 66 | Temp 98.2°F | Wt 108.0 lb

## 2011-06-10 DIAGNOSIS — R109 Unspecified abdominal pain: Secondary | ICD-10-CM

## 2011-06-10 DIAGNOSIS — R1084 Generalized abdominal pain: Secondary | ICD-10-CM

## 2011-06-10 DIAGNOSIS — R142 Eructation: Secondary | ICD-10-CM

## 2011-06-10 DIAGNOSIS — N390 Urinary tract infection, site not specified: Secondary | ICD-10-CM

## 2011-06-10 DIAGNOSIS — R634 Abnormal weight loss: Secondary | ICD-10-CM

## 2011-06-10 DIAGNOSIS — R141 Gas pain: Secondary | ICD-10-CM

## 2011-06-10 DIAGNOSIS — R140 Abdominal distension (gaseous): Secondary | ICD-10-CM

## 2011-06-10 LAB — POCT URINALYSIS DIPSTICK
Bilirubin, UA: NEGATIVE
Blood, UA: NEGATIVE
Glucose, UA: NEGATIVE
Ketones, UA: NEGATIVE
Nitrite, UA: NEGATIVE
Spec Grav, UA: >=1.03

## 2011-06-10 MED ORDER — HYOSCYAMINE SULFATE 0.125 MG SL SUBL: 0.1250 mg | SUBLINGUAL_TABLET | SUBLINGUAL | Status: DC | PRN

## 2011-06-10 NOTE — Patient Instructions (Signed)
Please complete stool cards Please try to go on My Chart within the next 24 hours to allow me to release the results directly to you.  

## 2011-06-10 NOTE — Progress Notes (Signed)
Addended by: Silvio Pate D on: 06/10/2011 04:17 PM   Modules accepted: Orders

## 2011-06-10 NOTE — Progress Notes (Signed)
  Subjective:    Patient ID: Melissa Pollard, female    DOB: October 24, 1940, 70 y.o.   MRN: [id number]  HPI ABDOMINAL PAIN: Location: diffuse  Onset: 10 years ago but worse in past 30 days with bloating   Radiation: no  Severity: doubles her over Quality: cramping Duration:intermittently  all day but worse in am  Better with: no relievers tried  Worse with: no      Past medical history/family history/social history were all reviewed and updated. Pertinent data: There is no personal or family history of ulcers, colitis, colon polyps, or colon cancer        Review of Systems Nausea/Vomiting: only nausea Diarrhea: no Constipation: no Melena/BRBPR: frank melena Hematemesis: no Anorexia: yes Fever/Chills: no Dysuria/ hematuria/pyuria:no  Rash: no  Wt loss: 25 # over 2 years EtOH use: occasionally  NSAIDs/ASA:occasionally Vaginal bleeding: no; she's had a gynecologic exam within the last year. As noted bloating began within the last 30 days      Objective:   Physical Exam  General appearance :thin & in  good health and nourishment w/o distress. Parkinsonian neuromuscular activity  Eyes: No conjunctival inflammation or scleral icterus is present.  Oral exam: Dental hygiene is good; lips and gums are healthy appearing.There is no oropharyngeal erythema or exudate noted.   Heart:  Normal rate and regular rhythm. S1 and S2 normal without gallop, murmur, or rub .   Loud mitral click   Lungs:Chest clear to auscultation; no wheezes, rhonchi,rales ,or rubs present.No increased work of breathing.   Abdomen: bowel sounds normal, soft and non-tender without masses, organomegaly or hernias noted.  No guarding or rebound . No flank tenderness to percussion  Skin:Warm & dry.  Intact without suspicious lesions or rashes ; no jaundice or tenting  Lymphatic: No lymphadenopathy is noted about the head, neck, axilla areas.   Vascular: All pulses intact without bruits or deficits              Assessment & Plan:  #1 abdominal pain, diffuse. Acute exacerbation the past 3 days associated with bloating.  #2 significant weight loss in the past 24 months.  Plan: See orders and recommendations

## 2011-06-11 ENCOUNTER — Emergency Department (HOSPITAL_COMMUNITY): Payer: Medicare Other

## 2011-06-11 ENCOUNTER — Encounter (HOSPITAL_COMMUNITY): Payer: Self-pay | Admitting: *Deleted

## 2011-06-11 ENCOUNTER — Emergency Department (HOSPITAL_COMMUNITY)
Admission: EM | Admit: 2011-06-11 | Discharge: 2011-06-11 | Disposition: A | Discharge status: Home / Self Care | Payer: Medicare Other | Attending: Internal Medicine | Admitting: Internal Medicine

## 2011-06-11 DIAGNOSIS — R079 Chest pain, unspecified: Secondary | ICD-10-CM | POA: Insufficient documentation

## 2011-06-11 DIAGNOSIS — F0390 Unspecified dementia without behavioral disturbance: Secondary | ICD-10-CM

## 2011-06-11 DIAGNOSIS — G20 Parkinson's disease: Secondary | ICD-10-CM

## 2011-06-11 DIAGNOSIS — I10 Essential (primary) hypertension: Secondary | ICD-10-CM

## 2011-06-11 DIAGNOSIS — K5900 Constipation, unspecified: Secondary | ICD-10-CM

## 2011-06-11 DIAGNOSIS — R0600 Dyspnea, unspecified: Secondary | ICD-10-CM

## 2011-06-11 DIAGNOSIS — R0602 Shortness of breath: Secondary | ICD-10-CM | POA: Insufficient documentation

## 2011-06-11 DIAGNOSIS — R0989 Other specified symptoms and signs involving the circulatory and respiratory systems: Secondary | ICD-10-CM | POA: Insufficient documentation

## 2011-06-11 DIAGNOSIS — R110 Nausea: Secondary | ICD-10-CM

## 2011-06-11 DIAGNOSIS — R4182 Altered mental status, unspecified: Secondary | ICD-10-CM

## 2011-06-11 DIAGNOSIS — R011 Cardiac murmur, unspecified: Secondary | ICD-10-CM | POA: Insufficient documentation

## 2011-06-11 DIAGNOSIS — G319 Degenerative disease of nervous system, unspecified: Secondary | ICD-10-CM | POA: Insufficient documentation

## 2011-06-11 DIAGNOSIS — F411 Generalized anxiety disorder: Secondary | ICD-10-CM

## 2011-06-11 DIAGNOSIS — Z79899 Other long term (current) drug therapy: Secondary | ICD-10-CM | POA: Insufficient documentation

## 2011-06-11 DIAGNOSIS — G20A1 Parkinson's disease without dyskinesia, without mention of fluctuations: Secondary | ICD-10-CM

## 2011-06-11 DIAGNOSIS — R0609 Other forms of dyspnea: Secondary | ICD-10-CM | POA: Insufficient documentation

## 2011-06-11 LAB — CBC WITH DIFFERENTIAL/PLATELET
Eosinophils Relative: 2.6 % (ref 0.0–5.0)
HCT: 37.1 % (ref 36.0–46.0)
Monocytes Relative: 9.2 % (ref 3.0–12.0)
Neutrophils Relative %: 51.4 % (ref 43.0–77.0)
Platelets: 159 10*3/uL (ref 150.0–400.0)
RBC: 3.88 Mil/uL (ref 3.87–5.11)
WBC: 7.4 10*3/uL (ref 4.5–10.5)

## 2011-06-11 LAB — URINALYSIS, ROUTINE W REFLEX MICROSCOPIC
Glucose, UA: NEGATIVE mg/dL
Hgb urine dipstick: NEGATIVE
Protein, ur: NEGATIVE mg/dL
Specific Gravity, Urine: 1.016 (ref 1.005–1.030)
pH: 7 (ref 5.0–8.0)

## 2011-06-11 LAB — COMPREHENSIVE METABOLIC PANEL
ALT: 12 U/L (ref 0–35)
AST: 21 U/L (ref 0–37)
Calcium: 10.3 mg/dL (ref 8.4–10.5)
Creatinine, Ser: 0.91 mg/dL (ref 0.50–1.10)
GFR calc Af Amer: 72 mL/min — ABNORMAL LOW (ref >90)
Glucose, Bld: 107 mg/dL — ABNORMAL HIGH (ref 70–99)
Sodium: 139 mEq/L (ref 135–145)
Total Protein: 6.7 g/dL (ref 6.0–8.3)

## 2011-06-11 LAB — DIFFERENTIAL
Basophils Relative: 0 % (ref 0–1)
Lymphs Abs: 2.8 10*3/uL (ref 0.7–4.0)
Monocytes Absolute: 0.4 10*3/uL (ref 0.1–1.0)
Monocytes Relative: 6 % (ref 3–12)
Neutro Abs: 3.2 10*3/uL (ref 1.7–7.7)
Neutrophils Relative %: 49 % (ref 43–77)

## 2011-06-11 LAB — CBC
HCT: 39.7 % (ref 36.0–46.0)
Hemoglobin: 13.2 g/dL (ref 12.0–15.0)
MCHC: 33.2 g/dL (ref 30.0–36.0)
RBC: 4.19 MIL/uL (ref 3.87–5.11)

## 2011-06-11 LAB — CARDIAC PANEL(CRET KIN+CKTOT+MB+TROPI)
CK, MB: 3.1 ng/mL (ref 0.3–4.0)
Relative Index: INVALID (ref 0.0–2.5)
Total CK: 66 U/L (ref 7–177)

## 2011-06-11 LAB — HEPATIC FUNCTION PANEL
ALT: 6 U/L (ref 0–35)
AST: 21 U/L (ref 0–37)
Alkaline Phosphatase: 60 U/L (ref 39–117)
Bilirubin, Direct: 0.1 mg/dL (ref 0.0–0.3)
Total Bilirubin: 0.8 mg/dL (ref 0.3–1.2)

## 2011-06-11 LAB — URINE MICROSCOPIC-ADD ON

## 2011-06-11 LAB — URINE CULTURE: Organism ID, Bacteria: NO GROWTH

## 2011-06-11 MED ORDER — CARBIDOPA-LEVODOPA 10-100 MG PO TABS
1.0000 | ORAL_TABLET | Freq: Three times a day (TID) | ORAL | Status: DC
Start: 2011-06-11 — End: 2011-06-11
  Administered 2011-06-11 (×2): 1 via ORAL
  Filled 2011-06-11 (×4): qty 1

## 2011-06-11 MED ORDER — ROPINIROLE HCL 1 MG PO TABS
1.0000 mg | ORAL_TABLET | Freq: Three times a day (TID) | ORAL | Status: DC
  Administered 2011-06-11 (×2): 1 mg via ORAL
  Filled 2011-06-11 (×4): qty 1

## 2011-06-11 MED ORDER — ONDANSETRON HCL 4 MG/2ML IJ SOLN
INTRAMUSCULAR | Status: AC
  Administered 2011-06-11: 06:00:00 via INTRAVENOUS
  Filled 2011-06-11: qty 2

## 2011-06-11 MED ORDER — PROMETHAZINE HCL 25 MG/ML IJ SOLN
12.5000 mg | Freq: Once | INTRAMUSCULAR | Status: AC
  Administered 2011-06-11: 12.5 mg via INTRAVENOUS
  Filled 2011-06-11: qty 1

## 2011-06-11 MED ORDER — ONDANSETRON HCL 4 MG/2ML IJ SOLN
4.0000 mg | Freq: Once | INTRAMUSCULAR | Status: AC
  Administered 2011-06-11: 4 mg via INTRAVENOUS
  Filled 2011-06-11: qty 2

## 2011-06-11 MED ORDER — AMLODIPINE BESYLATE 10 MG PO TABS
10.0000 mg | ORAL_TABLET | Freq: Every day | ORAL | Status: DC
  Administered 2011-06-11: 10 mg via ORAL
  Filled 2011-06-11: qty 1

## 2011-06-11 NOTE — ED Notes (Signed)
Pt woke up at 0300 SOB, and anxious. In and out of bed several times. No previous hx: of Afib.

## 2011-06-11 NOTE — ED Notes (Signed)
Admitting Doctor called and stated will be seeing patient shortly approx 20 minutes.  Notified patient and husband verbalized understanding.

## 2011-06-11 NOTE — ED Notes (Signed)
Spoke with Pharmacy will be sending medication shortly.

## 2011-06-11 NOTE — ED Notes (Signed)
Neurology stated ok to discharge spoke with Dr Jomarie Longs ordered patient to discharge given two medications Amlodipine and miralax.

## 2011-06-11 NOTE — ED Provider Notes (Signed)
History     CSN: [id number]  Arrival date & time 06/11/11  0603   First MD Initiated Contact with Patient 06/11/11 0615      Chief Complaint  Patient presents with  . Shortness of Breath  . Atrial Fibrillation    (Consider location/radiation/quality/duration/timing/severity/associated sxs/prior treatment) HPI 71 year old female presents emergency department with shortness of breath, anxiety and "agitation". Patient reports she was at her baseline yesterday, saw her primary care Dr., Dr. Alwyn Ren for some ongoing nausea and abdominal pain. She woke up this morning around 3 AM with the need to urinate. After going to the bathroom, she was unable to get back to sleep due to nausea, shortness of breath, the feeling of tenseness across her body. She denies chest pain she has not had vomiting. Patient denies previous history of similar episodes. No fevers no chills.  Past Medical History  Diagnosis Date  . MVP (mitral valve prolapse) 2002    MR ,TR. SBE prophylaxis    Past Surgical History  Procedure Date  . Dilation and curettage of uterus   . Tonsillectomy   . Colonoscopy 2002    Negative    Family History  Problem Relation Age of Onset  . Stroke Father   . Hypertension Father   . Heart failure Father   . Asthma Father   . Diabetes Cousin   . Stroke Mother   . Hypertension Mother   . Breast cancer Mother 86    History  Substance Use Topics  . Smoking status: Former Smoker    Quit date: 01/06/1995  . Smokeless tobacco: Not on file  . Alcohol Use: Yes     occasionally    OB History    Grav Para Term Preterm Abortions TAB SAB Ect Mult Living                  Review of Systems  All other systems reviewed and are negative.  other than listed in HPI  Allergies  Review of patient's allergies indicates no known allergies.  Home Medications   Current Outpatient Rx  Name Route Sig Dispense Refill  . CARBIDOPA-LEVODOPA 10-100 MG PO TABS Oral Take 1 tablet by  mouth 3 (three) times daily.      Marland Kitchen ROPINIROLE HCL 1 MG PO TABS Oral Take 1 mg by mouth 3 (three) times daily.      . SERTRALINE HCL 50 MG PO TABS  50 mg. 2 by mouth daily    . ONDANSETRON HCL 4 MG/5ML PO SOLN Oral Take 4 mg by mouth once.      BP 174/100  Pulse 80  Temp(Src) 98.5 F (36.9 C) (Oral)  Resp 10  SpO2 100%  Physical Exam  Nursing note and vitals reviewed. Constitutional: She is oriented to person, place, and time.       Parkinson facial pattern  HENT:  Head: Normocephalic and atraumatic.  Nose: Nose normal.  Mouth/Throat: Oropharynx is clear and moist. No oropharyngeal exudate.       White substance around mouth  Eyes: Conjunctivae and EOM are normal. Pupils are equal, round, and reactive to light.  Neck: Normal range of motion. Neck supple. No JVD present. No tracheal deviation present. No thyromegaly present.  Cardiovascular: Intact distal pulses.  Exam reveals no gallop and no friction rub.   Murmur heard.      Irregular rate  Pulmonary/Chest: Effort normal and breath sounds normal. No respiratory distress. She has no wheezes. She has no rales. She exhibits no  tenderness.  Abdominal: Soft. Bowel sounds are normal. She exhibits no distension and no mass. There is no tenderness. There is no rebound and no guarding.  Musculoskeletal: She exhibits no edema and no tenderness.  Lymphadenopathy:    She has no cervical adenopathy.  Neurological: She is alert and oriented to person, place, and time. No cranial nerve deficit. She exhibits abnormal muscle tone. Coordination normal.       Intermittent twitching in parkinson pattern  Skin: Skin is warm and dry. No rash noted. No erythema. No pallor.  Psychiatric: She has a normal mood and affect. Her behavior is normal. Judgment and thought content normal.    ED Course  Procedures (including critical care time)  Labs Reviewed  COMPREHENSIVE METABOLIC PANEL - Abnormal; Notable for the following:    Glucose, Bld 107 (*)      GFR calc non Af Amer 62 (*)    GFR calc Af Amer 72 (*)    All other components within normal limits  CARDIAC PANEL(CRET KIN+CKTOT+MB+TROPI)  CBC  DIFFERENTIAL  D-DIMER, QUANTITATIVE  URINALYSIS, ROUTINE W REFLEX MICROSCOPIC   Dg Chest 2 View  06/11/2011  *RADIOLOGY REPORT*  Clinical Data: Chest pain and shortness of breath.  CHEST - 2 VIEW  Comparison: None.  Findings: The lungs are clear.  Heart size is normal.  No pneumothorax or pleural fluid.  Severe convex left thoracolumbar scoliosis is noted.  IMPRESSION: No acute disease.  Scoliosis.  Original Report Authenticated By: Bernadene Bell. Maricela Curet, M.D.     Date: 06/11/2011  Rate: 76  Rhythm: premature atrial contractions (PAC) and sinus rhythm  QRS Axis: left  Intervals: frequent pacs  ST/T Wave abnormalities: normal  Conduction Disutrbances:right bundle branch block  Narrative Interpretation: pacs are new  Old EKG Reviewed: changes noted    1. Hypertension   2. Altered mental status   3. Dyspnea   4. Nausea   5. Parkinson's disease       MDM   71 yo female with episode of sob, anxiety, new frequent pacs on ekg.  Will check baseline labs, chest xray.       8:36 AM Discuss case with the patient's primary care provider, Dr. Alwyn Ren. He saw her in the office yesterday, and reports that she had a normal mentation at that time. He was concerned as she's had increased loss of weight, ongoing abdominal pain which has worsened over the last month and bloating. Patient had positive leukocyte esterase on a dipstick in the lab. He reports a culture is pending. He did not start antibiotics. He had ordered an ultrasound for potential ovarian cancer. He reports that she was not confused during their visit. I then spoke with the patient's husband who reports over the last year she has had increasing confusion that has worsened over the last 3 months. Patient was seen by her neurologist at [facility] on Tuesday, who was concerned and wanted her  to have a full workup given her loss of weight. Husband reports she is usually confused but improves after her morning medications. He reports she woke him up in the middle of night saying that she was having a heart attack complaining of chest pain shortness of breath. He attributed her symptoms to anxiety, but reports when he took her blood pressure her systolic was over 200.  Patient is a very poor historian and does appear to have some baseline dementia. Given potential urinary tract infection this could densely worsen her confusion. D-dimer is negative, PACs are  still present and patient's blood pressure has normalized, although she did arrive quite hypertensive. Will discuss with hospitalist admission given the unclear picture at this time for monitoring for chest pain, confusion, and uti.  Olivia Mackie, MD 06/11/11 [phone]

## 2011-06-11 NOTE — ED Notes (Signed)
Admit Doctor at bedside.  

## 2011-06-11 NOTE — ED Notes (Signed)
Husband at bedside.  States Patient intermittently confused because of parkinson's.

## 2011-06-11 NOTE — ED Notes (Addendum)
Pt arrived via [facility] from home c/o weakness and SOB. Pt has been progressively been getting worse for last month, but last 24 hrs has worse. Pt hx: of parkinson's.

## 2011-06-11 NOTE — ED Notes (Signed)
Paged [facility] for admission orders.

## 2011-06-11 NOTE — Consult Note (Signed)
[facility] CONSULT NOTE     Reason for Consult: confusion    HPI:    Melissa Pollard is an 71 y.o. female with known parkinsons disease. She recently was seen by her neurologist at [facility] last tuesday who did not change any of her medications. Her husband has noticed over the past 9 months she has had a decrease in her memory.  She has had  A difficult time following along with movies and forgetting different restaurants they have eaten at. Last night patient woke up and felt very anxious and her blood pressure was elevated.  Patient was brought to ED for further evaluation.   Past Medical History  Diagnosis Date  . MVP (mitral valve prolapse) 2002    MR ,TR. SBE prophylaxis    Past Surgical History  Procedure Date  . Dilation and curettage of uterus   . Tonsillectomy   . Colonoscopy 2002    Negative    Family History  Problem Relation Age of Onset  . Stroke Father   . Hypertension Father   . Heart failure Father   . Asthma Father   . Diabetes Cousin   . Stroke Mother   . Hypertension Mother   . Breast cancer Mother 39    Social History:  reports that she quit smoking about 16 years ago. She does not have any smokeless tobacco history on file. She reports that she drinks alcohol. She reports that she does not use illicit drugs.  No Known Allergies  Medications:    Prior to Admission:  (Not in a hospital admission) Scheduled:   . amLODipine  10 mg Oral Daily  . carbidopa-levodopa  1 tablet Oral TID  . ondansetron      . ondansetron  4 mg Intravenous Once  . promethazine  12.5 mg Intravenous Once  . rOPINIRole  1 mg Oral TID    Review of Systems - General ROS: negative for - chills, fatigue, fever or hot flashes Hematological and Lymphatic ROS: negative for - bruising, fatigue, jaundice or pallor Endocrine ROS: negative for - hair pattern changes, hot flashes, mood swings or skin changes Respiratory ROS: negative for - cough,  hemoptysis, orthopnea or wheezing Cardiovascular ROS: negative for - dyspnea on exertion, orthopnea, palpitations or shortness of breath Gastrointestinal ROS: negative for - abdominal pain, appetite loss, blood in stools, diarrhea or hematemesis Musculoskeletal ROS: negative for - joint pain, joint stiffness, joint swelling or muscle pain Neurological ROS: positive for - memory loss Dermatological ROS: negative for dry skin, pruritus and rash   Blood pressure 141/81, pulse 78, temperature 98.5 F (36.9 C), temperature source Oral, resp. rate 16, SpO2 98.00%.   Neurologic Examination:   Mental Status: Alert, oriented, thought content appropriate.  Speech fluent without evidence of aphasia. Able to follow 3 step commands without difficulty. Cranial Nerves: II-Visual fields grossly intact. III/IV/VI-Extraocular movements intact.  Pupils reactive bilaterally. V/VII-Smile symmetric with masked facies and minimal smile.  VIII-grossly intact IX/X-normal gag XI-bilateral shoulder shrug XII-midline tongue extension Motor: 5/5 bilaterally with slightly increased normal tone through out. Sensory: Pinprick and light touch intact throughout, bilaterally Deep Tendon Reflexes: 2+ bilateral UE and 1+ bilateral LE symmetric. Plantars downgoing bilaterally Cerebellar: Normal finger-to-nose,  normal heel-to-shin test.     Lab Results  Component Value Date/Time   CHOL 217* 06/16/2010 10:56 AM    Results for orders placed  during the hospital encounter of 06/11/11 (from the past 48 hour(s))  CARDIAC PANEL(CRET KIN+CKTOT+MB+TROPI)     Status: Normal   Collection Time   06/11/11  6:31 AM      Component Value Range Comment   Total CK 66  7 - 177 (U/L)    CK, MB 3.1  0.3 - 4.0 (ng/mL)    Troponin I <0.30  <0.30 (ng/mL)    Relative Index RELATIVE INDEX IS INVALID  0.0 - 2.5    COMPREHENSIVE METABOLIC PANEL     Status: Abnormal   Collection Time   06/11/11  6:32 AM      Component Value Range Comment    Sodium 139  135 - 145 (mEq/L)    Potassium 4.2  3.5 - 5.1 (mEq/L)    Chloride 103  96 - 112 (mEq/L)    CO2 26  19 - 32 (mEq/L)    Glucose, Bld 107 (*) 70 - 99 (mg/dL)    BUN 21  6 - 23 (mg/dL)    Creatinine, Ser 1.61  0.50 - 1.10 (mg/dL)    Calcium 09.6  8.4 - 10.5 (mg/dL)    Total Protein 6.7  6.0 - 8.3 (g/dL)    Albumin 3.9  3.5 - 5.2 (g/dL)    AST 21  0 - 37 (U/L)    ALT 12  0 - 35 (U/L)    Alkaline Phosphatase 69  39 - 117 (U/L)    Total Bilirubin 0.4  0.3 - 1.2 (mg/dL)    GFR calc non Af Amer 62 (*) >90 (mL/min)    GFR calc Af Amer 72 (*) >90 (mL/min)   CBC     Status: Normal   Collection Time   06/11/11  6:32 AM      Component Value Range Comment   WBC 6.5  4.0 - 10.5 (K/uL)    RBC 4.19  3.87 - 5.11 (MIL/uL)    Hemoglobin 13.2  12.0 - 15.0 (g/dL)    HCT 04.5  40.9 - 81.1 (%)    MCV 94.7  78.0 - 100.0 (fL)    MCH 31.5  26.0 - 34.0 (pg)    MCHC 33.2  30.0 - 36.0 (g/dL)    RDW 91.4  78.2 - 95.6 (%)    Platelets 157  150 - 400 (K/uL)   DIFFERENTIAL     Status: Normal   Collection Time   06/11/11  6:32 AM      Component Value Range Comment   Neutrophils Relative 49  43 - 77 (%)    Neutro Abs 3.2  1.7 - 7.7 (K/uL)    Lymphocytes Relative 44  12 - 46 (%)    Lymphs Abs 2.8  0.7 - 4.0 (K/uL)    Monocytes Relative 6  3 - 12 (%)    Monocytes Absolute 0.4  0.1 - 1.0 (K/uL)    Eosinophils Relative 2  0 - 5 (%)    Eosinophils Absolute 0.1  0.0 - 0.7 (K/uL)    Basophils Relative 0  0 - 1 (%)    Basophils Absolute 0.0  0.0 - 0.1 (K/uL)   D-DIMER, QUANTITATIVE     Status: Normal   Collection Time   06/11/11  7:49 AM      Component Value Range Comment   D-Dimer, Quant 0.38  0.00 - 0.48 (ug/mL-FEU)   URINALYSIS, ROUTINE W REFLEX MICROSCOPIC     Status: Abnormal   Collection Time   06/11/11  8:33 AM  Component Value Range Comment   Color, Urine YELLOW  YELLOW     APPearance CLOUDY (*) CLEAR     Specific Gravity, Urine 1.016  1.005 - 1.030     pH 7.0  5.0 - 8.0     Glucose, UA  NEGATIVE  NEGATIVE (mg/dL)    Hgb urine dipstick NEGATIVE  NEGATIVE     Bilirubin Urine NEGATIVE  NEGATIVE     Ketones, ur NEGATIVE  NEGATIVE (mg/dL)    Protein, ur NEGATIVE  NEGATIVE (mg/dL)    Urobilinogen, UA 0.2  0.0 - 1.0 (mg/dL)    Nitrite NEGATIVE  NEGATIVE     Leukocytes, UA SMALL (*) NEGATIVE    URINE MICROSCOPIC-ADD ON     Status: Normal   Collection Time   06/11/11  8:33 AM      Component Value Range Comment   Squamous Epithelial / LPF RARE  RARE     WBC, UA 0-2  <3 (WBC/hpf)    Bacteria, UA RARE  RARE      Dg Chest 2 View  06/11/2011  *RADIOLOGY REPORT*  Clinical Data: Chest pain and shortness of breath.  CHEST - 2 VIEW  Comparison: None.  Findings: The lungs are clear.  Heart size is normal.  No pneumothorax or pleural fluid.  Severe convex left thoracolumbar scoliosis is noted.  IMPRESSION: No acute disease.  Scoliosis.  Original Report Authenticated By: Bernadene Bell. Maricela Curet, M.D.   Ct Head Wo Contrast  06/11/2011  *RADIOLOGY REPORT*  Clinical Data: Confusion.  CT HEAD WITHOUT CONTRAST  Technique:  Contiguous axial images were obtained from the base of the skull through the vertex without contrast.  Comparison: None.  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  No mass lesion, mass effect, midline shift, hydrocephalus, or hemorrhage.  No ischemia/infarct identified. Paranasal sinuses are within normal limits.  IMPRESSION: Cerebral atrophy.  Otherwise negative CT head.  Original Report Authenticated By: Andreas Newport, M.D.     Assessment/Plan:   71 YO female with known parkinson's disease and 9 month decline in memory.  Memory decline likely due to progression of parkinson's.  Has seen her outpatient neurologist a couple of days ago.  Is well aware of her condition and is following closely.  Would recommend patient follow up with out patient neurologist as scheduled.       Felicie Morn PA-C [facility] [phone]  06/11/2011, 2:47 PM    Patient seen and examined. I agree with the above.  Thana Farr, MD [facility] [phone]  06/11/2011  3:27 PM

## 2011-06-11 NOTE — ED Notes (Signed)
Neurology at bedside. Patient ate entire food tray without incident.

## 2011-06-11 NOTE — ED Notes (Signed)
Patient resting comfortably on stretcher warm blanket provided.  States she has parkinson's for 10 years.

## 2011-06-11 NOTE — ED Notes (Signed)
Admit Doctor states plan consult neurology in ED and potentially discharge patient with husband.

## 2011-06-17 ENCOUNTER — Encounter: Payer: Medicare Other | Admitting: Internal Medicine

## 2011-06-23 ENCOUNTER — Ambulatory Visit (HOSPITAL_BASED_OUTPATIENT_CLINIC_OR_DEPARTMENT_OTHER)
Admission: RE | Admit: 2011-06-23 | Discharge: 2011-06-23 | Disposition: A | Discharge status: Home / Self Care | Payer: Medicare Other | Source: Ambulatory Visit | Attending: Internal Medicine | Admitting: Internal Medicine

## 2011-06-23 DIAGNOSIS — R143 Flatulence: Secondary | ICD-10-CM | POA: Insufficient documentation

## 2011-06-23 DIAGNOSIS — R141 Gas pain: Secondary | ICD-10-CM | POA: Insufficient documentation

## 2011-06-23 DIAGNOSIS — K8020 Calculus of gallbladder without cholecystitis without obstruction: Secondary | ICD-10-CM | POA: Insufficient documentation

## 2011-06-23 DIAGNOSIS — R142 Eructation: Secondary | ICD-10-CM | POA: Insufficient documentation

## 2011-06-23 DIAGNOSIS — R634 Abnormal weight loss: Secondary | ICD-10-CM | POA: Insufficient documentation

## 2011-06-23 DIAGNOSIS — R1084 Generalized abdominal pain: Secondary | ICD-10-CM

## 2011-06-23 DIAGNOSIS — R140 Abdominal distension (gaseous): Secondary | ICD-10-CM

## 2011-06-23 DIAGNOSIS — R109 Unspecified abdominal pain: Secondary | ICD-10-CM | POA: Insufficient documentation

## 2011-07-20 ENCOUNTER — Telehealth: Payer: Self-pay | Admitting: *Deleted

## 2011-07-20 DIAGNOSIS — R140 Abdominal distension (gaseous): Secondary | ICD-10-CM

## 2011-07-20 DIAGNOSIS — R110 Nausea: Secondary | ICD-10-CM

## 2011-07-20 NOTE — Telephone Encounter (Signed)
Received call from pt husband to advise pt has not received results from imaging 06-10-11, noted pt has parkinsons and gave verbal to speak with husband about results after verifying DOB, advised next OV to sign a release for the pt results to be given to him, pt understood. Advised the results were released via MY chart on 06-24-11 per noted imaging on 06-10-11, noted results as follows:  Entered by Pecola Lawless, MD at 06/24/2011 3:28 PM The liver cyst is of no concern but followup in the considered on an annual basis. Gallstones are present but there is no sign of inflammation around the gallbladder to suggest acute inflammation or passage of a stone. Gastroenterology referral recommended if symptoms persist or progress. Please report any change in signs or symptoms. Thank you for using My Chart; it has served [place] well. Hopp       MD Alwyn Ren made aware and advise pt needs referral for GI, this nurse advised referral rep that the pt sxs have returned and pt/husband noted as unable to log into MY chart nor understand the concept, however did advise husband that the status is listed as active, gave pt husband number to contact MY Chart to recall how to access and walk pt/husband through. Pt husband understood, placed referral in chart for GI, advised pt husband that someone will call her with apt asap per MD Alwyn Ren noted the same sxs will need to be accessed by GI per same advise via MY chart results, pt husband understood, advised referral rep to place rush on referral per pt/husband did not get results and sxs have returned. All parties advised verbal understanding.

## 2011-07-21 NOTE — Telephone Encounter (Signed)
[id number] pt did have an apt to GI today 07-21-11

## 2011-08-17 ENCOUNTER — Encounter: Payer: Self-pay | Admitting: Internal Medicine

## 2011-10-02 ENCOUNTER — Ambulatory Visit (INDEPENDENT_AMBULATORY_CARE_PROVIDER_SITE_OTHER): Payer: Medicare Other | Admitting: Family Medicine

## 2011-10-02 ENCOUNTER — Encounter: Payer: Self-pay | Admitting: Family Medicine

## 2011-10-02 ENCOUNTER — Telehealth: Payer: Self-pay | Admitting: Internal Medicine

## 2011-10-02 VITALS — BP 130/78 | HR 90 | Temp 98.1°F | Ht 61.0 in | Wt 102.0 lb

## 2011-10-02 DIAGNOSIS — W19XXXA Unspecified fall, initial encounter: Secondary | ICD-10-CM

## 2011-10-02 DIAGNOSIS — M25559 Pain in unspecified hip: Secondary | ICD-10-CM

## 2011-10-02 MED ORDER — HYDROCODONE-ACETAMINOPHEN 5-500 MG PO TABS: 1.0000 | ORAL_TABLET | Freq: Three times a day (TID) | ORAL | Status: DC | PRN

## 2011-10-02 NOTE — Telephone Encounter (Signed)
Caller: [name]/Patient; Patient Name: Melissa Pollard; PCP: Marga Melnick; Best Callback Phone Number: [phone].  Patient calling about fall which occurred 09/28/11.  Seemed to be fine initially, but onset of increasing pain and decreased mobility over past 24 hours.  Fell and hit right hip.  She was able to get up but notes spasms now when she moves.  Able to bear weight on that leg, but there is pain.  Per hip injury protocol, advised appointment within 4 hours; appointment scheduled 1430 10/02/11 with Dr. Beverely Low.

## 2011-10-02 NOTE — Progress Notes (Signed)
  Subjective:    Patient ID: Melissa Pollard, female    DOB: [birth year], 71 y.o.   MRN: [id number]  HPI Larey Seat 3 days ago.  Had mild pain in R hip after fall but this has progressively worsened to the point of constant pain.  Pain improves w/ holding pressure.  Pain is sharp w/ walking and weight bearing.  Trouble w/ stairs.  Reports she is able to rest comfortably at night.  No swelling, redness, or visible bruising.   Review of Systems For ROS see HPI     Objective:   Physical Exam  Vitals reviewed. Constitutional: She is oriented to person, place, and time.       Thin, frail appearing Severe parkinsonian tremor  Musculoskeletal: She exhibits tenderness (over R greater trochanter). She exhibits no edema.       No bruising or redness of R hip + TTP over R greater trochanter Good flexion and extension of hip, limited internal/external rotation due to Parkinson's rigidity but no pain  Neurological: She is alert and oriented to person, place, and time. Coordination (severe, uncontrollable parkinsonian tremors) abnormal.          Assessment & Plan:

## 2011-10-02 NOTE — Telephone Encounter (Signed)
Noted, patient to be seen today by Dr.Tabori. Per Dr.Hopper's protocol if pending appointment schedule ok to close encounter

## 2011-10-02 NOTE — Patient Instructions (Signed)
Go to the [employer] [facility]- turn R on [address], [address] on [address], and [address] on [address] Go to the [employer] on the 1st floor- we'll call you with your results Use the vicodin for pain Ice or heat for pain relief Hang in there!!!

## 2011-10-03 ENCOUNTER — Encounter: Payer: Self-pay | Admitting: Family Medicine

## 2011-10-03 NOTE — Assessment & Plan Note (Signed)
New.  Suspect deep bruising but due to tenderness over bony prominence will get xray to r/o fx.  Start pain meds.  Reviewed supportive care and red flags that should prompt return.  Pt expressed understanding and is in agreement w/ plan.

## 2011-10-03 NOTE — Assessment & Plan Note (Signed)
New.  Pt unable to relay circumstances surrounding fall.  Suspect it is due to her severe Parkinson's disease.

## 2011-10-05 ENCOUNTER — Ambulatory Visit (HOSPITAL_BASED_OUTPATIENT_CLINIC_OR_DEPARTMENT_OTHER)
Admission: RE | Admit: 2011-10-05 | Discharge: 2011-10-05 | Disposition: A | Discharge status: Home / Self Care | Payer: Medicare Other | Source: Ambulatory Visit | Attending: Family Medicine | Admitting: Family Medicine

## 2011-10-05 DIAGNOSIS — W19XXXA Unspecified fall, initial encounter: Secondary | ICD-10-CM

## 2011-10-05 DIAGNOSIS — M25559 Pain in unspecified hip: Secondary | ICD-10-CM | POA: Insufficient documentation

## 2011-12-31 ENCOUNTER — Telehealth: Payer: Self-pay | Admitting: Internal Medicine

## 2011-12-31 NOTE — Telephone Encounter (Signed)
Noted, will forward to MD as a FYI  

## 2011-12-31 NOTE — Telephone Encounter (Signed)
This patient's spouse called stating the pt is moving into [facility]. He just wanted Dr. Alwyn Ren to know. Call home # if more information desired.

## 2012-01-05 ENCOUNTER — Ambulatory Visit (INDEPENDENT_AMBULATORY_CARE_PROVIDER_SITE_OTHER): Payer: Medicare Other

## 2012-01-05 DIAGNOSIS — Z0000 Encounter for general adult medical examination without abnormal findings: Secondary | ICD-10-CM

## 2012-01-07 ENCOUNTER — Encounter: Payer: Self-pay | Admitting: *Deleted

## 2012-01-07 LAB — TB SKIN TEST
Induration: 0 mm
TB Skin Test: NEGATIVE

## 2012-01-26 ENCOUNTER — Encounter: Payer: Self-pay | Admitting: Internal Medicine

## 2012-02-20 ENCOUNTER — Other Ambulatory Visit: Payer: Self-pay

## 2012-05-05 ENCOUNTER — Other Ambulatory Visit (HOSPITAL_COMMUNITY): Payer: Self-pay | Admitting: Internal Medicine

## 2012-05-05 DIAGNOSIS — R569 Unspecified convulsions: Secondary | ICD-10-CM

## 2012-05-12 ENCOUNTER — Ambulatory Visit (HOSPITAL_COMMUNITY)
Admission: RE | Admit: 2012-05-12 | Discharge: 2012-05-12 | Disposition: A | Discharge status: Home / Self Care | Payer: Medicare Other | Source: Ambulatory Visit | Attending: Internal Medicine | Admitting: Internal Medicine

## 2012-05-12 DIAGNOSIS — R569 Unspecified convulsions: Secondary | ICD-10-CM | POA: Insufficient documentation

## 2012-05-12 NOTE — Progress Notes (Signed)
EEG Completed; Results Pending  

## 2012-05-13 NOTE — Procedures (Signed)
HISTORY:  A 72 year old female with episodes of altered awareness.  MEDICATIONS:  Unknown.  CONDITIONS OF RECORDING:  This is a 16-channel EEG carried out with the patient in the awake, drowsy, and asleep states.  DESCRIPTION:  During what was felt to be wakefulness, the posterior background rhythm consisted of mostly theta.  It was poorly organized and low voltage.  This was continuous and was seen not only during what was felt to be wakefulness, but also during drowse as well.  The patient also goes into a light sleep during the tracing, in which there was noted to be further slowing of the background rhythm with superimposed symmetrical sleep spindles and vertex central sharp transients. Hyperventilation and intermittent photic stimulation were not performed.  IMPRESSION:  This EEG is characterized by slowing despite the state of the patient.  Although this may be consistent with drowse, cannot rule out the possibility of a more diffuse disturbance that is etiologically nonspecific, but may be related to a metabolic encephalopathy or medication effect.  No epileptiform activity was noted.          ______________________________ Thana Farr, MD    [id number] D:  05/13/2012 07:30:38  T:  05/13/2012 08:36:12  Job #:  [id number]

## 2012-05-26 ENCOUNTER — Observation Stay (HOSPITAL_COMMUNITY)
Admission: EM | Admit: 2012-05-26 | Discharge: 2012-05-27 | Disposition: A | Discharge status: Nursing Home (Includes ICF) | Payer: Medicare Other | Attending: Internal Medicine | Admitting: Internal Medicine

## 2012-05-26 ENCOUNTER — Emergency Department (HOSPITAL_COMMUNITY): Payer: Medicare Other

## 2012-05-26 ENCOUNTER — Encounter (HOSPITAL_COMMUNITY): Payer: Self-pay

## 2012-05-26 DIAGNOSIS — R634 Abnormal weight loss: Secondary | ICD-10-CM | POA: Diagnosis not present

## 2012-05-26 DIAGNOSIS — R1313 Dysphagia, pharyngeal phase: Secondary | ICD-10-CM | POA: Diagnosis not present

## 2012-05-26 DIAGNOSIS — R030 Elevated blood-pressure reading, without diagnosis of hypertension: Secondary | ICD-10-CM | POA: Insufficient documentation

## 2012-05-26 DIAGNOSIS — F411 Generalized anxiety disorder: Secondary | ICD-10-CM | POA: Diagnosis not present

## 2012-05-26 DIAGNOSIS — F329 Major depressive disorder, single episode, unspecified: Secondary | ICD-10-CM | POA: Insufficient documentation

## 2012-05-26 DIAGNOSIS — Z79899 Other long term (current) drug therapy: Secondary | ICD-10-CM | POA: Insufficient documentation

## 2012-05-26 DIAGNOSIS — Z681 Body mass index (BMI) 19 or less, adult: Secondary | ICD-10-CM | POA: Diagnosis not present

## 2012-05-26 DIAGNOSIS — R0602 Shortness of breath: Secondary | ICD-10-CM | POA: Insufficient documentation

## 2012-05-26 DIAGNOSIS — G3183 Dementia with Lewy bodies: Secondary | ICD-10-CM | POA: Insufficient documentation

## 2012-05-26 DIAGNOSIS — J984 Other disorders of lung: Principal | ICD-10-CM | POA: Insufficient documentation

## 2012-05-26 DIAGNOSIS — F3289 Other specified depressive episodes: Secondary | ICD-10-CM | POA: Insufficient documentation

## 2012-05-26 DIAGNOSIS — D72829 Elevated white blood cell count, unspecified: Secondary | ICD-10-CM | POA: Insufficient documentation

## 2012-05-26 DIAGNOSIS — F0280 Dementia in other diseases classified elsewhere without behavioral disturbance: Secondary | ICD-10-CM | POA: Diagnosis not present

## 2012-05-26 DIAGNOSIS — R1310 Dysphagia, unspecified: Secondary | ICD-10-CM

## 2012-05-26 HISTORY — DX: Unspecified dementia, unspecified severity, without behavioral disturbance, psychotic disturbance, mood disturbance, and anxiety: F03.90

## 2012-05-26 LAB — BASIC METABOLIC PANEL
BUN: 26 mg/dL — ABNORMAL HIGH (ref 6–23)
Calcium: 9.2 mg/dL (ref 8.4–10.5)
Chloride: 107 mEq/L (ref 96–112)
Creatinine, Ser: 0.9 mg/dL (ref 0.50–1.10)
GFR calc Af Amer: 72 mL/min — ABNORMAL LOW (ref >90)

## 2012-05-26 LAB — URINALYSIS, ROUTINE W REFLEX MICROSCOPIC
Bilirubin Urine: NEGATIVE
Ketones, ur: NEGATIVE mg/dL
Nitrite: NEGATIVE
Protein, ur: NEGATIVE mg/dL
pH: 6 (ref 5.0–8.0)

## 2012-05-26 LAB — CBC WITH DIFFERENTIAL/PLATELET
Basophils Absolute: 0 10*3/uL (ref 0.0–0.1)
Basophils Relative: 0 % (ref 0–1)
Eosinophils Absolute: 0 10*3/uL (ref 0.0–0.7)
Eosinophils Relative: 0 % (ref 0–5)
HCT: 33.4 % — ABNORMAL LOW (ref 36.0–46.0)
Hemoglobin: 11.5 g/dL — ABNORMAL LOW (ref 12.0–15.0)
MCH: 32.4 pg (ref 26.0–34.0)
MCHC: 34.4 g/dL (ref 30.0–36.0)
MCV: 94.1 fL (ref 78.0–100.0)
Monocytes Absolute: 0.7 10*3/uL (ref 0.1–1.0)
Monocytes Relative: 5 % (ref 3–12)
RDW: 13.6 % (ref 11.5–15.5)

## 2012-05-26 LAB — PRO B NATRIURETIC PEPTIDE: Pro B Natriuretic peptide (BNP): 2186 pg/mL — ABNORMAL HIGH (ref 0–125)

## 2012-05-26 LAB — CG4 I-STAT (LACTIC ACID): Lactic Acid, Venous: 0.8 mmol/L (ref 0.5–2.2)

## 2012-05-26 MED ORDER — PROMETHAZINE HCL 25 MG PO TABS: 12.5000 mg | ORAL_TABLET | Freq: Four times a day (QID) | ORAL | Status: DC | PRN

## 2012-05-26 MED ORDER — ENOXAPARIN SODIUM 40 MG/0.4ML ~~LOC~~ SOLN
40.0000 mg | SUBCUTANEOUS | Status: DC
  Administered 2012-05-26: 40 mg via SUBCUTANEOUS
  Filled 2012-05-26 (×2): qty 0.4

## 2012-05-26 MED ORDER — ACETAMINOPHEN 325 MG PO TABS: 650.0000 mg | ORAL_TABLET | Freq: Four times a day (QID) | ORAL | Status: DC | PRN

## 2012-05-26 MED ORDER — CARBIDOPA-LEVODOPA 25-100 MG PO TABS
0.5000 | ORAL_TABLET | ORAL | Status: DC
  Administered 2012-05-27 (×2): 0.5 via ORAL
  Filled 2012-05-26 (×7): qty 0.5

## 2012-05-26 MED ORDER — SODIUM CHLORIDE 0.9 % IJ SOLN: 3.0000 mL | INTRAMUSCULAR | Status: DC | PRN

## 2012-05-26 MED ORDER — CARBIDOPA-LEVODOPA 25-100 MG PO TABS
0.5000 | ORAL_TABLET | ORAL | Status: DC
  Filled 2012-05-26: qty 0.5

## 2012-05-26 MED ORDER — CARBIDOPA-LEVODOPA CR 25-100 MG PO TBCR
1.0000 | EXTENDED_RELEASE_TABLET | ORAL | Status: DC
  Filled 2012-05-26: qty 1

## 2012-05-26 MED ORDER — SODIUM CHLORIDE 0.9 % IJ SOLN
3.0000 mL | Freq: Two times a day (BID) | INTRAMUSCULAR | Status: DC
  Administered 2012-05-26 – 2012-05-27 (×2): 3 mL via INTRAVENOUS

## 2012-05-26 MED ORDER — SERTRALINE HCL 100 MG PO TABS
100.0000 mg | ORAL_TABLET | Freq: Every day | ORAL | Status: DC
  Administered 2012-05-27: 100 mg via ORAL
  Filled 2012-05-26: qty 1

## 2012-05-26 MED ORDER — FUROSEMIDE 10 MG/ML IJ SOLN
20.0000 mg | Freq: Once | INTRAMUSCULAR | Status: AC
  Administered 2012-05-26: 20 mg via INTRAVENOUS
  Filled 2012-05-26: qty 2

## 2012-05-26 MED ORDER — ALUM & MAG HYDROXIDE-SIMETH 200-200-20 MG/5ML PO SUSP: 30.0000 mL | Freq: Four times a day (QID) | ORAL | Status: DC | PRN

## 2012-05-26 MED ORDER — CLONAZEPAM 0.5 MG PO TABS: 0.2500 mg | ORAL_TABLET | Freq: Two times a day (BID) | ORAL | Status: DC | PRN

## 2012-05-26 MED ORDER — SODIUM CHLORIDE 0.9 % IV SOLN
INTRAVENOUS | Status: AC
  Administered 2012-05-26: 21:00:00 via INTRAVENOUS

## 2012-05-26 MED ORDER — SODIUM CHLORIDE 0.9 % IV SOLN: 250.0000 mL | INTRAVENOUS | Status: DC | PRN

## 2012-05-26 MED ORDER — ACETAMINOPHEN 650 MG RE SUPP
650.0000 mg | Freq: Four times a day (QID) | RECTAL | Status: DC | PRN
  Administered 2012-05-26 – 2012-05-27 (×2): 650 mg via RECTAL
  Filled 2012-05-26 (×2): qty 1

## 2012-05-26 MED ORDER — CLONAZEPAM 0.5 MG PO TABS
0.5000 mg | ORAL_TABLET | Freq: Every day | ORAL | Status: DC
Start: 2012-05-26 — End: 2012-05-27

## 2012-05-26 MED ORDER — CARBIDOPA-LEVODOPA CR 25-100 MG PO TBCR
1.0000 | EXTENDED_RELEASE_TABLET | Freq: Every day | ORAL | Status: DC
  Administered 2012-05-26 – 2012-05-27 (×4): 1 via ORAL
  Filled 2012-05-26 (×9): qty 1

## 2012-05-26 NOTE — ED Notes (Signed)
Per [facility] patient was reported to be normal this am, then later had an "episode" of gurgling and foaming at the mouth. Patient had O2 saturations of mid 80% to low 90% before EMS applied a nonrebreather mask, per EMS.

## 2012-05-26 NOTE — ED Notes (Signed)
Pt has white frothy sputum. Oral suction used. Pt maintaining O2 sats on nonrebreather

## 2012-05-26 NOTE — ED Notes (Signed)
Dr. Peyton Najjar made aware that pt family is requesting her home parkinson medications

## 2012-05-26 NOTE — ED Notes (Signed)
Pt appears to have less difficultly breathing. No secretions noted. Pt appears more alert as well. Family at bedside

## 2012-05-26 NOTE — ED Provider Notes (Signed)
Patient history of Parkinson's initially presented with some respiratory distress and difficulty swallowing. Her mental status was depressed. She did open eyes to verbal stimuli and had an intact gag reflex. She had a lot of prescriptions and was nasotracheal suctioned. She improved markedly after this. Her oxygen saturation was normal and she was transitioned to a nasal cannula. She was more alert and not her baseline mental status per her husband. Her chest x-ray did not demonstrate pneumonia. However given ED course she had another similar episode and she's also been unable to pass a swallowing screen. Given this we will go ahead and plan on admission.  Rolan Bucco, MD 05/26/12 1440

## 2012-05-26 NOTE — ED Notes (Signed)
Per Dr. Fredderick Phenix, RT will hold off on placing pt on BiPAP.  Pts WOB has decreased and pt able to maintain her airway much better.

## 2012-05-26 NOTE — H&P (Signed)
Hospital Admission Note Date: 05/26/2012  Patient name: Melissa Pollard Medical record number: [id number] Date of birth: 1940/06/20 Age: 72 y.o. Gender: female PCP: [facility] Provider [phone] Fax [phone], Dr. Jerold Coombe (Neurology [phone]), Dr. Sheral Apley (psychiatry) Pharmacy: [facility]   Medical Service: Internal Medicine  Attending physician: Dr. Rogelia Boga  1st Contact: Dr. Shirlee Latch Pager: [phone]  2nd Contact: Dr. Dorthula Rue (5/22 and 5/23 only) /Dr. Dierdre Searles Pager:[phone], [phone]  After 5 pm or weekends:  1st Contact: Pager: [phone]  2nd Contact: Pager: [phone]   Chief Complaint: Respiratory distress   History of Present Illness: 72 y.o PMH Parkinson's disease with dementia (diagnosed since 2002) presented to [facility] from from [facility] after around 10 am noted they noted the patient to be foaming at the mouth, gurgling, difficulty breathing/shortness of breath.  Her O2 sats were 88% on room air, and her heart rate was 120s to 150s. EMS noted she desaturated to the mid 80s to low 90s and they applied a nonrebreather.On arrival to the [facility] ED she initially presented with some respiratory distress and difficulty swallowing and altered mental status (i.e she did open eyes to verbal stimuli and had an intact gag reflex) Once she was nasotracheal suctioned she improved markedly and her oxygen saturation was normal and she was transitioned to a nasal cannula doing well.  Review of systems hard to obtain but she and her husband mention a subjective fever, dysphagia (worsening x 2 years more acutely for 3 months eating a regular diet).  Her husband states she has been coughing/choking with eating prior to admission.  Her baseline dementia is worsening x 1 year and she has a history of visual hallucinations.  Baseline walks with cane intermittently, can feed herself and shower herself occasionally but without consistently getting medications her mental status fluctuates per  her husband.  She did not pass the swallow study in the Emergency Room on admission.  Patient is eating a regular diet at the facility where she lives. She is not on home O2.  She is a resident of [facility] nursing home since 01/2012  Meds: Current Outpatient Rx  Name  Route  Sig  Dispense  Refill  . carbidopa-levodopa (SINEMET CR) 25-100 MG per tablet   Oral   Take 1 tablet by mouth 5 (five) times daily.         . carbidopa-levodopa (SINEMET IR) 25-100 MG per tablet   Oral   Take 0.5 tablets by mouth 4 (four) times daily.          . clonazePAM (KLONOPIN) 0.5 MG tablet   Oral   Take 0.5 mg by mouth at bedtime.          . clonazePAM (KLONOPIN) 0.5 MG tablet   Oral   Take 0.25-0.5 mg by mouth daily as needed for anxiety.         Donald Prose (FLORA-Q) CAPS   Oral   Take 1 capsule by mouth daily.         Marland Kitchen ibuprofen (ADVIL,MOTRIN) 400 MG tablet   Oral   Take 400 mg by mouth every 8 (eight) hours as needed for pain. Take with food         . megestrol (MEGACE) 40 MG/ML suspension   Oral   Take 800 mg by mouth daily.         . promethazine (PHENERGAN) 12.5 MG tablet   Oral   Take 12.5 mg by mouth every 6 (six) hours as  needed for nausea.         . sertraline (ZOLOFT) 50 MG tablet   Oral   Take 100 mg by mouth daily. 2 by mouth daily           Allergies: Allergies as of 05/26/2012  . (No Known Allergies)   Past Medical History  Diagnosis Date  . MVP (mitral valve prolapse) 2002    MR ,TR. SBE prophylaxis  . Dementia    Past Surgical History  Procedure Laterality Date  . Dilation and curettage of uterus    . Tonsillectomy    . Colonoscopy  2002    Negative   Family History  Problem Relation Age of Onset  . Stroke Father   . Hypertension Father   . Heart failure Father   . Asthma Father   . Diabetes Cousin   . Stroke Mother   . Hypertension Mother   . Breast cancer Mother 25   History   Social History  . Marital Status: Married     Spouse Name: N/A    Number of Children: N/A  . Years of Education: N/A   Occupational History  . Not on file.   Social History Main Topics  . Smoking status: Former Smoker    Quit date: 01/06/1995  . Smokeless tobacco: Not on file  . Alcohol Use: Yes     Comment: occasionally  . Drug Use: No  . Sexually Active: Not on file   Other Topics Concern  . 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 Some ROS from husband and notes from the facility) General: +subjective fever HEENT: +dysphagia (worsening x 2 years more acutely for 3 months)  Cardiac: denies chest pain Pulm: +shortness of breath (not on home O2), +coughing/choking noted with eating Abd: denies abdominal pain  Ext: not assessed  Neuro: +dementia (worsening x 1 year), h/o visual hallucinations.  Baseline walks with cane intermittently, can feed herself and shower herself occasionally but without consistently getting medications her mental status fluctuates   Physical Exam: 102, 97%, 20, 112/72 (81) Blood pressure 106/57, pulse 72, temperature 100.2 F (37.9 C), temperature source Rectal, resp. rate 21, SpO2 98.00%. Vitals reviewed. General: resting in bed, NAD, oriented to person only, not dob or location, cachetic  HEENT: [place]/at, perrl b/l, no scleral icterus Cardiac: tachycardic, no rubs, murmurs or gallops Pulm: clear to auscultation bilaterally, no wheezes, rales, or rhonchi Abd: soft, nontender, nondistended, BS present Ext: warm and well perfused, no pedal edema, deformities to toes Neuro: alert and oriented to person only, grossly neurologically intact, able to move and 4 extremities 4/5 strength in all 4 extremities Skin: ecchymoses to skin  Lab results: Basic Metabolic Panel:  Recent Labs   05/26/12 1144  NA 138  K 4.2  CL 107  CO2 19  GLUCOSE 115*  BUN 26*  CREATININE 0.90  CALCIUM 9.2   Liver Function Tests: No results found for this basename: AST, ALT, ALKPHOS, BILITOT, PROT, ALBUMIN,  in the last 72 hours No results found for this basename: LIPASE, AMYLASE,  in the last 72 hours No results found for this basename: AMMONIA,  in the last 72 hours CBC:  Recent Labs  05/26/12 1144  WBC  12.9*  NEUTROABS 9.2*  HGB 11.5*  HCT 33.4*  MCV 94.1  PLT 175   BNP:  Recent Labs  05/26/12 1145  PROBNP 2186.0*   Urinalysis:  Recent Labs  05/26/12 1220  COLORURINE YELLOW  LABSPEC 1.022  PHURINE 6.0  GLUCOSEU NEGATIVE  HGBUR NEGATIVE  BILIRUBINUR NEGATIVE  KETONESUR NEGATIVE  PROTEINUR NEGATIVE  UROBILINOGEN 0.2  NITRITE NEGATIVE  LEUKOCYTESUR NEGATIVE   Misc. Labs: CMET  Imaging results:  Dg Chest 1 View  05/26/2012   *RADIOLOGY REPORT*  Clinical Data: Severe respiratory distress.  CHEST - 1 VIEW  Comparison: Two-view chest x-ray 06/11/2011.  Findings: Cardiac silhouette upper normal in size for AP technique, unchanged.  Thoracic aorta mildly atherosclerotic, unchanged. Hilar and mediastinal contours otherwise unremarkable.  Lungs clear.  Bronchovascular markings normal.  Pulmonary vascularity normal.  No pneumothorax.  No pleural effusions.  Thoracolumbar scoliosis convex left again noted.  IMPRESSION: No acute cardiopulmonary disease.   Original Report Authenticated By: Hulan Saas, M.D.   Ct Head Wo Contrast  05/26/2012   *RADIOLOGY REPORT*  Clinical Data: Shortness of breath, patient was found unresponsive.  CT HEAD WITHOUT CONTRAST  Technique:  Contiguous axial images were obtained from the base of the skull through the vertex without contrast.  Comparison: 06/11/2011  Findings:  Exam detail is diminished due to motion artifact.  There is diffuse patchy low density throughout the subcortical and periventricular white matter consistent with chronic  small vessel ischemic change.  There is prominence of the sulci and ventricles consistent with brain atrophy.  The paranasal sinuses are clear.  The mastoid air cells are clear. The skull appears intact.  IMPRESSION:  1.  Exam detail diminish due to motion artifact. 2.  Small vessel ischemic change and brain atrophy.   Original Report Authenticated By: Signa Kell, M.D.    Other results: EKG: irregular rhythm, rate 100, axis normal, normal P and QRS intervals, no LVH, no ST/T changes a lot of artifact.  Will repeat EKG.   Assessment & Plan by Problem:  72 y.o PMH Parkinsons Disease presents with acute respiratory distress likely due to dysphagia.    1. Acute respiratory distress likely secondary to dysphagia -After suctioning the patient's O2 saturation improved.  There is concern for dysphagia as the patient is eating a regular diet at the facility.   -Patient will likely need modified Barium Swallow or Flexible Endoscopic Evaluation of Swallowing  Treatment includes food/liquid modifications, behavioral/environmental manipulation, positioning, strengthening exercises if those options fail then medical management (i.e feeding tube) -pending SLP evaluation -saturating well on nasal cannula  -bipap prn  2. History of Parkinson's Disease and dementia -Possibly related to Lewy body dementia as the patient has hallucinations and fluctuations in cognition and alertness  -Worsening which can affect swallowing and ADLs as it is in this patient.   -She is on her home medications for PD and has an outpatient neurologist Dr. Lorin Picket at [facility].   -She is DNR   3. Leukocytosis  -WBC 12.9.  CXR is normal.  She has been afebrile.  UA is negative.  May be due to acute stress.   -If she spikes a fever overnight or declines. Repeat a CXR and start antibiotics for aspiration  4. Elevated blood pressure  -No history of hypertension. BP on admission 202/131 likely due to respiratory distress  -Will monitor  via telemetry   5. History of Depression/Anxiety -Resumed home Klonopin 0.25-0.5 mg bid prn, 0.5 mg qhs, Zoloft 100 mg qd  6. F/E/N -NSL -will monitor and replace electrolytes prn  -NPO until SLP evaluation  7. DVT px  -scds, Lovenox    Vilma Prader Comella husband [phone], [phone]   Dispo: Disposition is deferred at this time, awaiting improvement of current medical problems. Anticipated discharge in approximately 1-2 day(s).   The patient does have a current PCP at [facility] therefore will not requiring OPC follow-up after discharge.   The patient does not have transportation limitations that hinder transportation to clinic appointments.  SignedAnnett Gula [phone] 05/26/2012, 5:54 PM

## 2012-05-26 NOTE — ED Notes (Signed)
Pt NTS'd with small amt of thick, white, frothy secretions returned.  Pt noted to be in respiratory distress before and after NTS.  Site left in tact and no complications were noted.  Per Dr. Fredderick Phenix, RT will place pt on BIPAP.

## 2012-05-26 NOTE — ED Provider Notes (Signed)
History     CSN: [id number]  Arrival date & time 05/26/12  1035   First MD Initiated Contact with Patient 05/26/12 1040      Chief Complaint  Patient presents with  . Shortness of Breath    Patient is a 72 y.o. female presenting with shortness of breath. The history is provided by the EMS personnel. The history is limited by the condition of the patient (demenita, altered mental status).  Shortness of Breath Severity:  Severe Onset quality:  Sudden Duration: 1-2 hours prior to arrival. Timing:  Constant Progression:  Unchanged Chronicity:  New Context comment:  At rest.  nursing home staff noted that the patient had "an "episode of gurgling and foaming at the mouth Relieved by:  Nothing Worsened by:  Nothing tried Ineffective treatments:  None tried Associated symptoms: cough and sputum production   Associated symptoms: no chest pain, no diaphoresis, no fever, no hemoptysis and no vomiting     Past Medical History  Diagnosis Date  . MVP (mitral valve prolapse) 2002    MR ,TR. SBE prophylaxis  . Dementia     Past Surgical History  Procedure Laterality Date  . Dilation and curettage of uterus    . Tonsillectomy    . Colonoscopy  2002    Negative    Family History  Problem Relation Age of Onset  . Stroke Father   . Hypertension Father   . Heart failure Father   . Asthma Father   . Diabetes Cousin   . Stroke Mother   . Hypertension Mother   . Breast cancer Mother 39    History  Substance Use Topics  . Smoking status: Former Smoker    Quit date: 01/06/1995  . Smokeless tobacco: Not on file  . Alcohol Use: Yes     Comment: occasionally    OB History   Grav Para Term Preterm Abortions TAB SAB Ect Mult Living                  Review of Systems  Unable to perform ROS: Dementia  Constitutional: Negative for fever and diaphoresis.  Respiratory: Positive for cough, sputum production and shortness of breath. Negative for hemoptysis.   Cardiovascular:  Negative for chest pain.  Gastrointestinal: Negative for vomiting.  Neurological: Positive for tremors.    Allergies  Review of patient's allergies indicates no known allergies.  Home Medications   No current outpatient prescriptions on file.  BP 179/89  Pulse 108  Temp(Src) 100.2 F (37.9 C) (Rectal)  Resp 32  SpO2 98%  Physical Exam  Nursing note and vitals reviewed. Constitutional: She appears well-developed and well-nourished. No distress.  In respiratory distress, tachypnea, difficulty handling secretions, coughing purulent secretions, gagging, hypoxic  HENT:  Head: Normocephalic and atraumatic.  Mouth/Throat: Oropharynx is clear and moist.  Eyes: Conjunctivae and EOM are normal. Pupils are equal, round, and reactive to light. No scleral icterus.  Neck: Normal range of motion. Neck supple. No JVD present. No tracheal deviation present.  Cardiovascular: Regular rhythm, normal heart sounds and intact distal pulses.  Tachycardia present.  Exam reveals no gallop and no friction rub.   No murmur heard. Pulmonary/Chest: Tachypnea noted. She is in respiratory distress. She has no decreased breath sounds. She has no wheezes. She has rhonchi (diffusely). She has no rales.  Abdominal: Soft. Bowel sounds are normal. She exhibits no distension. There is no tenderness. There is no rebound and no guarding.  Musculoskeletal: She exhibits no edema.  Neurological:  She is alert. She has normal strength. She is disoriented. No cranial nerve deficit. She exhibits normal muscle tone. Coordination normal. GCS eye subscore is 4. GCS verbal subscore is 4. GCS motor subscore is 6.  Parkinsonian, masked faces.  Tremor of right foot.    Skin: Skin is warm and dry. She is not diaphoretic.    ED Course  Procedures (including critical care time)  Labs Reviewed  CBC WITH DIFFERENTIAL - Abnormal; Notable for the following:    WBC 12.9 (*)    RBC 3.55 (*)    Hemoglobin 11.5 (*)    HCT 33.4 (*)     Neutro Abs 9.2 (*)    All other components within normal limits  BASIC METABOLIC PANEL - Abnormal; Notable for the following:    Glucose, Bld 115 (*)    BUN 26 (*)    GFR calc non Af Amer 62 (*)    GFR calc Af Amer 72 (*)    All other components within normal limits  PRO B NATRIURETIC PEPTIDE - Abnormal; Notable for the following:    Pro B Natriuretic peptide (BNP) 2186.0 (*)    All other components within normal limits  URINE CULTURE  CULTURE, BLOOD (ROUTINE X 2)  CULTURE, BLOOD (ROUTINE X 2)  URINALYSIS, ROUTINE W REFLEX MICROSCOPIC  COMPREHENSIVE METABOLIC PANEL  CBC  CG4 I-STAT (LACTIC ACID)   Dg Chest 1 View  05/26/2012   *RADIOLOGY REPORT*  Clinical Data: Severe respiratory distress.  CHEST - 1 VIEW  Comparison: Two-view chest x-ray 06/11/2011.  Findings: Cardiac silhouette upper normal in size for AP technique, unchanged.  Thoracic aorta mildly atherosclerotic, unchanged. Hilar and mediastinal contours otherwise unremarkable.  Lungs clear.  Bronchovascular markings normal.  Pulmonary vascularity normal.  No pneumothorax.  No pleural effusions.  Thoracolumbar scoliosis convex left again noted.  IMPRESSION: No acute cardiopulmonary disease.   Original Report Authenticated By: Hulan Saas, M.D.   Ct Head Wo Contrast  05/26/2012   *RADIOLOGY REPORT*  Clinical Data: Shortness of breath, patient was found unresponsive.  CT HEAD WITHOUT CONTRAST  Technique:  Contiguous axial images were obtained from the base of the skull through the vertex without contrast.  Comparison: 06/11/2011  Findings:  Exam detail is diminished due to motion artifact.  There is diffuse patchy low density throughout the subcortical and periventricular white matter consistent with chronic small vessel ischemic change.  There is prominence of the sulci and ventricles consistent with brain atrophy.  The paranasal sinuses are clear.  The mastoid air cells are clear. The skull appears intact.  IMPRESSION:  1.  Exam  detail diminish due to motion artifact. 2.  Small vessel ischemic change and brain atrophy.   Original Report Authenticated By: Signa Kell, M.D.     1. Shortness of breath   2. Dysphagia       MDM  72 yo F with Parkinsons, dementia presents with respiratory distress, difficulty handling secretions, purulent sputum.   Afebrile, tachycardic, hypertensive, hypoxic to low-mid 80s on room air, improved to 90s with non-rebreather.    CT head negative for acute IC abnormality. Considered BiPAP early, but doubt reversible process, and patient quickly and dramatically improved with NT suction.  Able to place on nasal canula and eventually room air. CXR does not demonstrate any acute cardiopulmonary disease.  Labs peritnent for leukocytosis, mild anemia, BNP 2100.  Gave 20 mg lasix IV.  Her hypoxia likely 2/2 severe dysphagia from parkinsons, pooling of secretions, mucus plugging.  Not clinically  convincing for pneumonia at this time, though early process/aspiration is possible.  Maybe a component of undiagnosed CHF as well.    Considered discharge in accordance with family wishes, but patient unable to take PO meds prior to d/c due to ongoing severe dysphagia.  Admitted to medicine for further management.       Toney Sang, MD 05/26/12 2207

## 2012-05-26 NOTE — ED Notes (Signed)
Respiratory at bedside.

## 2012-05-26 NOTE — ED Notes (Signed)
Faith Rogue, MD, made aware of family report that patient swallows medication whole in applesauce at home facility with some difficulty. MD approved administration of Sinemet in applesauce.

## 2012-05-27 ENCOUNTER — Encounter (HOSPITAL_COMMUNITY): Payer: Self-pay | Admitting: *Deleted

## 2012-05-27 ENCOUNTER — Observation Stay (HOSPITAL_COMMUNITY): Payer: Medicare Other

## 2012-05-27 DIAGNOSIS — R1310 Dysphagia, unspecified: Secondary | ICD-10-CM

## 2012-05-27 LAB — COMPREHENSIVE METABOLIC PANEL
ALT: <5 U/L (ref 0–35)
AST: 33 U/L (ref 0–37)
Alkaline Phosphatase: 48 U/L (ref 39–117)
CO2: 20 mEq/L (ref 19–32)
Calcium: 9.2 mg/dL (ref 8.4–10.5)
Chloride: 106 mEq/L (ref 96–112)
GFR calc Af Amer: 73 mL/min — ABNORMAL LOW (ref >90)
GFR calc non Af Amer: 63 mL/min — ABNORMAL LOW (ref >90)
Glucose, Bld: 95 mg/dL (ref 70–99)
Sodium: 140 mEq/L (ref 135–145)
Total Bilirubin: 0.9 mg/dL (ref 0.3–1.2)

## 2012-05-27 LAB — GLUCOSE, CAPILLARY: Glucose-Capillary: 87 mg/dL (ref 70–99)

## 2012-05-27 LAB — CBC
Hemoglobin: 11.6 g/dL — ABNORMAL LOW (ref 12.0–15.0)
MCH: 32.3 pg (ref 26.0–34.0)
MCHC: 33.8 g/dL (ref 30.0–36.0)
MCV: 95.5 fL (ref 78.0–100.0)
RDW: 13.5 % (ref 11.5–15.5)

## 2012-05-27 LAB — URINE CULTURE: Colony Count: NO GROWTH

## 2012-05-27 LAB — MRSA PCR SCREENING: MRSA by PCR: NEGATIVE

## 2012-05-27 MED ORDER — ENSURE COMPLETE PO LIQD: 237.0000 mL | Freq: Two times a day (BID) | ORAL | Status: AC

## 2012-05-27 MED ORDER — ENSURE COMPLETE PO LIQD: 237.0000 mL | Freq: Two times a day (BID) | ORAL | Status: DC

## 2012-05-27 NOTE — Discharge Summary (Signed)
Internal Medicine Teaching [facility] Discharge Note  Name: Melissa Pollard MRN: [id number] DOB: Aug 19, 1940 72 y.o.  Date of Admission: 05/26/2012 10:35 AM Date of Discharge: 05/27/2012 Attending Physician: Burns Spain, MD  Discharge Diagnosis: 1. Acute respiratory distress likely secondary to dysphagia  2. History of Parkinson's Disease and dementia  3. Leukocytosis  4. Elevated blood pressure  5. History of Depression/Anxiety     Discharge Medications:   Medication List    STOP taking these medications       megestrol 40 MG/ML suspension  Commonly known as:  MEGACE      TAKE these medications       carbidopa-levodopa 25-100 MG per tablet  Commonly known as:  SINEMET CR  Take 1 tablet by mouth 5 (five) times daily.     carbidopa-levodopa 25-100 MG per tablet  Commonly known as:  SINEMET IR  Take 0.5 tablets by mouth 4 (four) times daily.     carbidopa-levodopa 25-100 MG per tablet  Commonly known as:  SINEMET CR  Take 1 tablet by mouth at bedtime as needed.     clonazePAM 0.5 MG tablet  Commonly known as:  KLONOPIN  Take 0.5 mg by mouth at bedtime.     clonazePAM 0.5 MG tablet  Commonly known as:  KLONOPIN  Take 0.25-0.5 mg by mouth daily as needed for anxiety.     feeding supplement Liqd  Take 237 mLs by mouth 2 (two) times daily between meals.     Flora-Q Caps  Take 1 capsule by mouth daily.     ibuprofen 400 MG tablet  Commonly known as:  ADVIL,MOTRIN  Take 400 mg by mouth every 8 (eight) hours as needed for pain. Take with food     promethazine 12.5 MG tablet  Commonly known as:  PHENERGAN  Take 12.5 mg by mouth every 6 (six) hours as needed for nausea.     sertraline 50 MG tablet  Commonly known as:  ZOLOFT  Take 100 mg by mouth daily. 2 by mouth daily        Disposition and follow-up:   Ms.Melissa Pollard was discharged from [facility] in stable condition.  At the hospital follow up visit please address  1) Follow  up with Speech Lang. Pathology (SLP). Recommend Home health PT/SLP 2) Repeat CBC 3) Arrange physician follow up in 2 days to 1 week 4) Dysphagia 3 diet/Mechanical soft diet thin liquids; May crush appropriate medications.   Follow-up Appointments: Follow-up Information   Follow up with your primary care physician. Schedule an appointment as soon as possible for a visit in 2 days. (for reevaluation)      Discharge Orders   Future Orders Complete By Expires     Discharge instructions  As directed     Comments:      Please let a medical provider see the patient in 2 days to 1 week hospital follow up Patient has dysphagia and needs to be on a dysphagia 3 diet    Increase activity slowly  As directed        Consultations:  Speech Language Pathology  Procedures Performed:  Dg Chest 1 View  05/26/2012   *RADIOLOGY REPORT*  Clinical Data: Severe respiratory distress.  CHEST - 1 VIEW  Comparison: Two-view chest x-ray 06/11/2011.  Findings: Cardiac silhouette upper normal in size for AP technique, unchanged.  Thoracic aorta mildly atherosclerotic, unchanged. Hilar and mediastinal contours otherwise unremarkable.  Lungs clear.  Bronchovascular markings normal.  Pulmonary  vascularity normal.  No pneumothorax.  No pleural effusions.  Thoracolumbar scoliosis convex left again noted.  IMPRESSION: No acute cardiopulmonary disease.   Original Report Authenticated By: Hulan Saas, M.D.   Ct Head Wo Contrast  05/26/2012   *RADIOLOGY REPORT*  Clinical Data: Shortness of breath, patient was found unresponsive.  CT HEAD WITHOUT CONTRAST  Technique:  Contiguous axial images were obtained from the base of the skull through the vertex without contrast.  Comparison: 06/11/2011  Findings:  Exam detail is diminished due to motion artifact.  There is diffuse patchy low density throughout the subcortical and periventricular white matter consistent with chronic small vessel ischemic change.  There is prominence of  the sulci and ventricles consistent with brain atrophy.  The paranasal sinuses are clear.  The mastoid air cells are clear. The skull appears intact.  IMPRESSION:  1.  Exam detail diminish due to motion artifact. 2.  Small vessel ischemic change and brain atrophy.   Original Report Authenticated By: Signa Kell, M.D.   Dg Chest Port 1 View  05/27/2012   *RADIOLOGY REPORT*  Clinical Data: Fever  PORTABLE CHEST - 1 VIEW  Comparison: 05/26/2012  Findings: The heart and pulmonary vascularity are stable.  Old rib fractures are noted on the right.  The lungs are clear.  The scoliosis is again identified and stable.  IMPRESSION: No acute abnormality is noted.   Original Report Authenticated By: Alcide Clever, M.D.   Dg Swallowing Func-speech Pathology  05/27/2012   Breck Coons [place], CCC-SLP     05/27/2012 11:26 AM Objective Swallowing Evaluation: Modified Barium Swallowing Study   Patient Details  Name: Melissa Pollard MRN: [id number] Date of Birth: 1940/05/27  Today's Date: 05/27/2012 Time: 0940-1000 SLP Time Calculation (min): 20 min  Past Medical History:  Past Medical History  Diagnosis Date  . MVP (mitral valve prolapse) 2002    MR ,TR. SBE prophylaxis  . Dementia    Past Surgical History:  Past Surgical History  Procedure Laterality Date  . Dilation and curettage of uterus    . Tonsillectomy    . Colonoscopy  2002    Negative   HPI:  72 y.o PMH Parkinson's disease with dementia (diagnosed since  2002) presented to [facility] from from [facility] patient  found foaming at the mouth, gurgling, difficulty  breathing/shortness of breath. Her O2 sats were 88% on room air,  and her heart rate was 120s to 150s. EMS noted she desaturated to  the mid 80s to low 90s and they applied a nonrebreather.On  arrival to the [facility] ED she initially presented with some  respiratory distress and difficulty swallowing and altered mental  status (i.e she did open eyes to verbal stimuli and had an intact  gag reflex).  CXR 5/22  No acute cardiopulmonary disease and  repeat CXR 5/23 revealed No acute abnormality as well.  Review of  systems hard to obtain but she and her husband mention a  subjective fever, dysphagia (worsening x 2 years more acutely for  3 months eating a regular diet). Her husband states she has been  coughing/choking with eating prior to admission. Her baseline  dementia is worsening x 1 year and she has a history of visual  hallucinations. Other PMH includes CVA, HTN, DM.  She did not  pass the swallow study in the Emergency Room on admission.  Patient is eating a regular diet at the facility where she lives.  She is not on home O2. She is a  resident of [facility]  nursing home since 01/2012      Assessment / Plan / Recommendation Clinical Impression  Dysphagia Diagnosis: Moderate oral phase dysphagia;Mild  pharyngeal phase dysphagia Clinical impression: Pt. exhibited mild-moderate oral dysphagia  with slower mastication and manipulation with mechanical soft  texture.  First consistency provided was applesauce which took  pt. approximately 3-4 minutes to enter pt.'s UES ( moved from  posterior oral cavity to vallecuale then to pyriform sinuses)  likely due to Parkinson-like episode preventing timely  manipulation with first trial.  Subsequent initiation of po's  ranged from only 3-8 seconds.  Adequate airway protection without  any penetration or aspiration episodes exhibited.  Trace residue  in pyriform sinuses.  Therapeutic intervention needed to assist  with transit of puree including verbal cues and tactile cues with  dry spoon.  Pt. is at higher aspiration risk with Parkinson's.    SLP spoke to pt.'s husband who reported she eats a regular  texture diet at [facility].  recommends Dys 3 texture and  thin liquids.  Recommend pt. consume solids versus liquids at  begining of meal to assist with swallow initiation and  facilitation of motor pattern, pills crushed, straws ok and full  supervision.  He may  require a downgrade in diet texture if  exhibiting difficutly since she is in a declined state at present  time.      Treatment Recommendation  Therapy as outlined in treatment plan below    Diet Recommendation Thin liquid;Dysphagia 3 (Mechanical Soft)   Liquid Administration via: Cup;Straw Medication Administration: Crushed with puree Supervision: Patient able to self feed;Full supervision/cueing  for compensatory strategies Compensations: Slow rate;Small sips/bites Postural Changes and/or Swallow Maneuvers: Seated upright 90  degrees    Other  Recommendations Oral Care Recommendations: Oral care BID   Follow Up Recommendations   (to be determined)    Frequency and Duration min 1 x/week  2 weeks   Pertinent Vitals/Pain none    SLP Swallow Goals Patient will utilize recommended strategies during swallow to  increase swallowing safety with: Maximal cueing   General HPI: 72 y.o PMH Parkinson's disease with dementia  (diagnosed since 2002) presented to [facility] from from [facility] patient found foaming at the mouth, gurgling, difficulty  breathing/shortness of breath. Her O2 sats were 88% on room air,  and her heart rate was 120s to 150s. EMS noted she desaturated to  the mid 80s to low 90s and they applied a nonrebreather.On  arrival to the [facility] ED she initially presented with some  respiratory distress and difficulty swallowing and altered mental  status (i.e she did open eyes to verbal stimuli and had an intact  gag reflex).  CXR 5/22 No acute cardiopulmonary disease and  repeat CXR 5/23 revealed No acute abnormality as well.  Review of  systems hard to obtain but she and her husband mention a  subjective fever, dysphagia (worsening x 2 years more acutely for  3 months eating a regular diet). Her husband states she has been  coughing/choking with eating prior to admission. Her baseline  dementia is worsening x 1 year and she has a history of visual  hallucinations. Other PMH includes CVA, HTN, DM.   She did not  pass the swallow study in the Emergency Room on admission.  Patient is eating a regular diet at the facility where she lives.  She is not on home O2. She is a resident of [facility]  nursing  home since 01/2012  Type of Study: Modified Barium Swallowing Study Reason for Referral: Objectively evaluate swallowing function Previous Swallow Assessment: none Diet Prior to this Study: NPO Temperature Spikes Noted: Yes Respiratory Status: Room air History of Recent Intubation: No Behavior/Cognition: Alert;Confused;Distractible;Requires cueing Oral Cavity - Dentition: Adequate natural dentition Oral Motor / Sensory Function:  (generalized weakness) Self-Feeding Abilities: Able to feed self;Needs assist;Needs set  up Patient Positioning: Upright in chair Baseline Vocal Quality: Low vocal intensity Volitional Cough: Weak Anatomy:  (possible cervical osteophytes) Pharyngeal Secretions: Not observed secondary MBS    Reason for Referral Objectively evaluate swallowing function   Oral Phase Oral Preparation/Oral Phase Oral Phase: Impaired Oral - Nectar Oral - Nectar Cup: Delayed oral transit Oral - Thin Oral - Thin Cup: Delayed oral transit Oral - Solids Oral - Puree: Delayed oral transit;Reduced posterior propulsion  (delayed prep, manipulation 1st trial) Oral - Mechanical Soft: Delayed oral transit;Weak lingual  manipulation;Impaired mastication   Pharyngeal Phase Pharyngeal Phase Pharyngeal Phase: Impaired Pharyngeal - Nectar Pharyngeal - Nectar Cup: Delayed swallow initiation;Premature  spillage to valleculae;Pharyngeal residue - pyriform  sinuses;Reduced tongue base retraction (trace) Pharyngeal - Thin Pharyngeal - Thin Cup: Pharyngeal residue - pyriform  sinuses;Reduced tongue base retraction;Delayed swallow  initiation;Premature spillage to pyriform sinuses (trace) Pharyngeal - Solids Pharyngeal - Puree: Delayed swallow initiation;Premature spillage  to pyriform sinuses  Cervical Esophageal Phase    GO     Cervical Esophageal Phase Cervical Esophageal Phase: Melissa Pollard         Melissa Pollard.Ed CCC-SLP Pager [phone]  05/27/2012    2D Echo: none  Cardiac Cath: none   Admission HPI:  Chief Complaint: Respiratory distress  History of Present Illness:  72 y.o PMH Parkinson's disease with dementia (diagnosed since 2002) presented to [facility] from from [facility] after around 10 am noted they noted the patient to be foaming at the mouth, gurgling, difficulty breathing/shortness of breath. Her O2 sats were 88% on room air, and her heart rate was 120s to 150s. EMS noted she desaturated to the mid 80s to low 90s and they applied a nonrebreather.On arrival to the [facility] ED she initially presented with some respiratory distress and difficulty swallowing and altered mental status (i.e she did open eyes to verbal stimuli and had an intact gag reflex) Once she was nasotracheal suctioned she improved markedly and her oxygen saturation was normal and she was transitioned to a nasal cannula doing well. Review of systems hard to obtain but she and her husband mention a subjective fever, dysphagia (worsening x 2 years more acutely for 3 months eating a regular diet). Her husband states she has been coughing/choking with eating prior to admission. Her baseline dementia is worsening x 1 year and she has a history of visual hallucinations. Baseline walks with cane intermittently, can feed herself and shower herself occasionally but without consistently getting medications her mental status fluctuates per her husband. She did not pass the swallow study in the Emergency Room on admission. Patient is eating a regular diet at the facility where she lives. She is not on home O2. She is a resident of [facility] nursing home since 01/2012   Review of Systems (Unable to accurately access due to baseline dementia. Some ROS from husband and notes from the facility)  General: +subjective fever  HEENT: +dysphagia  (worsening x 2 years more acutely for 3 months)  Cardiac: denies chest pain  Pulm: +shortness of breath (not on home O2), +coughing/choking noted with  eating  Abd: denies abdominal pain  Ext: not assessed  Neuro: +dementia (worsening x 1 year), h/o visual hallucinations. Baseline walks with cane intermittently, can feed herself and shower herself occasionally but without consistently getting medications her mental status fluctuates  Physical Exam:  102, 97%, 20, 112/72 (81)  Blood pressure 106/57, pulse 72, temperature 100.2 F (37.9 C), temperature source Rectal, resp. rate 21, SpO2 98.00%.  Vitals reviewed.  General: resting in bed, NAD, oriented to person only, not dob or location, cachetic  HEENT: [place]/at, perrl b/l, no scleral icterus  Cardiac: tachycardic, no rubs, murmurs or gallops  Pulm: clear to auscultation bilaterally, no wheezes, rales, or rhonchi  Abd: soft, nontender, nondistended, BS present  Ext: warm and well perfused, no pedal edema, deformities to toes  Neuro: alert and oriented to person only, grossly neurologically intact, able to move and 4 extremities 4/5 strength in all 4 extremities  Skin: ecchymoses to skin     Hospital Course by problem list: 1. Acute respiratory distress likely secondary to dysphagia  2. History of Parkinson's Disease and dementia  3. Leukocytosis  4. Elevated blood pressure  5. History of Depression/Anxiety       72 y.o PMH Parkinsons Disease presents with acute respiratory distress likely due to dysphagia.   1. Acute respiratory distress likely secondary to dysphagia  Etiology likely due to oral pharyngeal dysphagia due to Parkinsons. After suctioning the patient's O2 saturation improved and she was placed on nasal cannula. Patient was eating a regular diet prior to admission.  She had a modified barium swallow this admission and will need to be on a dysphagia 3 diet, no exceptions.  Dysphagia 3 diet/Mechanical soft diet thin liquids;  May crush appropriate medications.  Treatment for dysphagia includes food/liquid modifications, behavioral/environmental manipulation, positioning, strengthening exercises if those options fail then medical management (i.e feeding tube).  All these may help she is at risk for aspiration.  She should NOT be on a regular diet and should eat a dysphagia 3 diet according to speech therapy.   2. History of Parkinson's Disease and dementia  Possibly related to Lewy body dementia as the patient has hallucinations and fluctuations in cognition and alertness.  Per family Parkinson's Disease is worsening which can affect swallowing and activities of daily living (ADLs) as it is in this patient.  She is on her home medications and has an outpatient neurologist Dr. Lorin Picket at [facility].  Her code status is DNR/I  3. Leukocytosis  Trended up.  Tmax of 100.2 F this admission.  Urine was negative this admission and two chest Xrays have been negative this admission.  Leukocytosis may be due to acute stress.   4. Elevated blood pressure  -No history of hypertension. Blood pressure on admission 202/131 likely due to respiratory distress. Most recent blood pressure was 130/94   5. History of Depression/Anxiety  We resumed home Klonopin 0.25-0.5 mg bid as needed and 0.5 mg at night and Zoloft 100 mg daily   She was on sequential compression devices and Lovenox this admission.     Discharge Vitals:  BP 130/94  Pulse 88  Temp(Src) 98.4 F (36.9 C) (Oral)  Resp 20  Ht 5\' 6"  (1.676 m)  Wt 89 lb 1.6 oz (40.415 kg)  BMI 14.39 kg/m2  SpO2 98%  Physical exam:  General: sitting in recliner eating strawberry shortcake, NAD, alert and oriented x 1  HEENT: /at no scleral icterus  Cardiac: RRR, no rubs, murmurs or gallops  Pulm: clear  to auscultation bilaterally, no wheezes, rales, or rhonchi  Abd: soft, nontender, nondistended, BS present  Ext: warm and well perfused, no pedal edema  Neuro: alert and oriented X1,  moving all 4 extremities, grossly neurologically intact   Discharge Labs:  Results for Melissa, Pollard (MRN [id number]) as of 05/27/2012 12:41  Ref. Range 05/26/2012 11:44 05/26/2012 11:45 05/26/2012 12:07 05/27/2012 04:56  Sodium Latest Range: 135-145 mEq/L 138   140  Potassium Latest Range: 3.5-5.1 mEq/L 4.2   3.8  Chloride Latest Range: 96-112 mEq/L 107   106  CO2 Latest Range: 19-32 mEq/L 19   20  BUN Latest Range: 6-23 mg/dL 26 (H)   26 (H)  Creatinine Latest Range: 0.50-1.10 mg/dL 4.09   8.11  Calcium Latest Range: 8.4-10.5 mg/dL 9.2   9.2  GFR calc non Af Amer Latest Range: >90 mL/min 62 (L)   63 (L)  GFR calc Af Amer Latest Range: >90 mL/min 72 (L)   73 (L)  Glucose Latest Range: 70-99 mg/dL 914 (H)   95  Alkaline Phosphatase Latest Range: 39-117 U/L    48  Albumin Latest Range: 3.5-5.2 g/dL    3.5  AST Latest Range: 0-37 U/L    33  ALT Latest Range: 0-35 U/L    <5  Total Protein Latest Range: 6.0-8.3 g/dL    6.0  Total Bilirubin Latest Range: 0.3-1.2 mg/dL    0.9  Pro B Natriuretic peptide (BNP) Latest Range: 0-125 pg/mL  2186.0 (H)    Lactic Acid, Venous Latest Range: 0.5-2.2 mmol/L   0.80    Results for Melissa, Pollard (MRN [id number]) as of 05/27/2012 12:41  Ref. Range 05/26/2012 11:44 05/27/2012 04:56  WBC Latest Range: 4.0-10.5 K/uL 12.9 (H) 13.8 (H)  RBC Latest Range: 3.87-5.11 MIL/uL 3.55 (L) 3.59 (L)  Hemoglobin Latest Range: 12.0-15.0 g/dL 08.6 (L) 57.8 (L)  HCT Latest Range: 36.0-46.0 % 33.4 (L) 34.3 (L)  MCV Latest Range: 78.0-100.0 fL 94.1 95.5  MCH Latest Range: 26.0-34.0 pg 32.4 32.3  MCHC Latest Range: 30.0-36.0 g/dL 46.9 62.9  RDW Latest Range: 11.5-15.5 % 13.6 13.5  Platelets Latest Range: 150-400 K/uL 175 197  Neutrophils Relative % Latest Range: 43-77 % 71   Lymphocytes Relative Latest Range: 12-46 % 23   Monocytes Relative Latest Range: 3-12 % 5   Eosinophils Relative Latest Range: 0-5 % 0   Basophils Relative Latest Range: 0-1 % 0   NEUT# Latest Range:  1.7-7.7 K/uL 9.2 (H)   Lymphocytes Absolute Latest Range: 0.7-4.0 K/uL 3.0   Monocytes Absolute Latest Range: 0.1-1.0 K/uL 0.7   Eosinophils Absolute Latest Range: 0.0-0.7 K/uL 0.0   Basophils Absolute Latest Range: 0.0-0.1 K/uL 0.0    Results for Melissa, Pollard (MRN [id number]) as of 05/27/2012 12:41  Ref. Range 05/26/2012 12:20  Color, Urine Latest Range: YELLOW  YELLOW  APPearance Latest Range: CLEAR  CLEAR  Specific Gravity, Urine Latest Range: 1.005-1.030  1.022  pH Latest Range: 5.0-8.0  6.0  Glucose Latest Range: NEGATIVE mg/dL NEGATIVE  Bilirubin Urine Latest Range: NEGATIVE  NEGATIVE  Ketones, ur Latest Range: NEGATIVE mg/dL NEGATIVE  Protein Latest Range: NEGATIVE mg/dL NEGATIVE  Urobilinogen, UA Latest Range: 0.0-1.0 mg/dL 0.2  Nitrite Latest Range: NEGATIVE  NEGATIVE  Leukocytes, UA Latest Range: NEGATIVE  NEGATIVE  Hgb urine dipstick Latest Range: NEGATIVE  NEGATIVE   Results for Melissa, Pollard (MRN [id number]) as of 05/27/2012 12:41  Ref. Range 05/26/2012 23:58  MRSA by PCR Latest Range: NEGATIVE  NEGATIVE    Results for  orders placed during the hospital encounter of 05/26/12 (from the past 24 hour(s))  MRSA PCR SCREENING     Status: None   Collection Time    05/26/12 11:58 PM      Result Value Range   MRSA by PCR NEGATIVE  NEGATIVE  GLUCOSE, CAPILLARY     Status: None   Collection Time    05/27/12  4:12 AM      Result Value Range   Glucose-Capillary 87  70 - 99 mg/dL  COMPREHENSIVE METABOLIC PANEL     Status: Abnormal   Collection Time    05/27/12  4:56 AM      Result Value Range   Sodium 140  135 - 145 mEq/L   Potassium 3.8  3.5 - 5.1 mEq/L   Chloride 106  96 - 112 mEq/L   CO2 20  19 - 32 mEq/L   Glucose, Bld 95  70 - 99 mg/dL   BUN 26 (*) 6 - 23 mg/dL   Creatinine, Ser 1.61  0.50 - 1.10 mg/dL   Calcium 9.2  8.4 - 09.6 mg/dL   Total Protein 6.0  6.0 - 8.3 g/dL   Albumin 3.5  3.5 - 5.2 g/dL   AST 33  0 - 37 U/L   ALT <5  0 - 35 U/L   Alkaline Phosphatase  48  39 - 117 U/L   Total Bilirubin 0.9  0.3 - 1.2 mg/dL   GFR calc non Af Amer 63 (*) >90 mL/min   GFR calc Af Amer 73 (*) >90 mL/min  CBC     Status: Abnormal   Collection Time    05/27/12  4:56 AM      Result Value Range   WBC 13.8 (*) 4.0 - 10.5 K/uL   RBC 3.59 (*) 3.87 - 5.11 MIL/uL   Hemoglobin 11.6 (*) 12.0 - 15.0 g/dL   HCT 04.5 (*) 40.9 - 81.1 %   MCV 95.5  78.0 - 100.0 fL   MCH 32.3  26.0 - 34.0 pg   MCHC 33.8  30.0 - 36.0 g/dL   RDW 91.4  78.2 - 95.6 %   Platelets 197  150 - 400 K/uL    Signed: Annett Gula 05/27/2012, 2:46 PM   Time Spent on Discharge: >30 minutes  Services Ordered on Discharge: Home health PT, SLP Equipment Ordered on Discharge: none

## 2012-05-27 NOTE — Progress Notes (Signed)
INITIAL NUTRITION ASSESSMENT  DOCUMENTATION CODES Per approved criteria  -Severe malnutrition in the context of chronic illness -Underweight   INTERVENTION: 1. Ensure Complete po BID, each supplement provides 350 kcal and 13 grams of protein. 2. Pt would likely benefit from PEG tube placement to supplement oral intake and promote weight maintenance.  NUTRITION DIAGNOSIS: Increased nutrient needs related to Parkinson's as evidenced by weight loss.   Goal: PO intake to meet >/=90% estimated nutrition needs  Monitor:  PO intake, weight trends, labs, I/O's  Reason for Assessment: Malnutrition Screening Tool, Consult for poor po intkae   72 y.o. female  Admitting Dx: Paralysis agitans  ASSESSMENT: Pt admitted from SNF with foaming at the mouth, gurgling, and difficulty breathing. Pt with Parkinsons and hx of dysphagia. Pt was eating a regular diet at the SNF. SLP evaluated with MBS, diet advanced to dysphagia 3 with thin liquids.  Per malnutrition screening tool report pt has been losing weight over several years.  Weight listed in chart is incorrect. Likely pt weighs 89 lbs, not 89 kg. This would indicate 64 lb weight loss over 6 years. More recently pt with 13% body weight loss in 8 months.  Pt was sitting in chair on RD visit so was not able to get a new weight. Pt states she eats all of her meals at the SNF, but it takes her a long time. Family in room confirms that pt does eat well, but unsure if she skips meals when he is not present.   Nutrition Focused Physical Exam:  Subcutaneous Fat:  Orbital Region: mild wasting Upper Arm Region: mild wasting Thoracic and Lumbar Region: n/a  Muscle:  Temple Region: mild wasting Clavicle Bone Region: severe wasting Clavicle and Acromion Bone Region: severe wasting Scapular Bone Region: n/a Dorsal Hand: n/a Patellar Region: severe wasting  Anterior Thigh Region: severe wasting Posterior Calf Region: severe wasting   Pt meets  criteria for severe malnutrition in the context of chronic illness 2/2 weight loss of 13% body weight in 8 months and severe muscle wasting.     Height: Ht Readings from Last 1 Encounters:  05/26/12 5\' 6"  (1.676 m)    Weight: Wt Readings from Last 1 Encounters:  05/27/12 196 lb 6.9 oz (89.1 kg)    Ideal Body Weight: 130 lbs   % Ideal Body Weight: 150%  Wt Readings from Last 10 Encounters:  05/27/12 196 lb 6.9 oz (89.1 kg)  10/02/11 102 lb (46.267 kg)  06/10/11 108 lb (48.988 kg)  02/02/11 113 lb 3.2 oz (51.347 kg)  06/16/10 119 lb (53.978 kg)  06/14/09 130 lb 9.6 oz (59.24 kg)  06/14/08 138 lb (62.596 kg)  06/15/07 147 lb (66.679 kg)  05/24/06 153 lb (69.4 kg)  current weight is likely 89 lbs not 89 kg   Usual Body Weight: 110 lbs in 2013   % Usual Body Weight: 81%  BMI:  14.4 kg/(m^2) Underweight   Estimated Nutritional Needs: Kcal: 1300-1500 Protein: 50-60 gm  Fluid: >/= 1.5  L   Skin: stage I on sacrum   Diet Order: NPO  EDUCATION NEEDS: -No education needs identified at this time   Intake/Output Summary (Last 24 hours) at 05/27/12 1131 Last data filed at 05/27/12 1023  Gross per 24 hour  Intake    420 ml  Output   1650 ml  Net  -1230 ml    Last BM: 5/23   Labs:   Recent Labs Lab 05/26/12 1144 05/27/12 0456  NA 138  140  K 4.2 3.8  CL 107 106  CO2 19 20  BUN 26* 26*  CREATININE 0.90 0.89  CALCIUM 9.2 9.2  GLUCOSE 115* 95    CBG (last 3)   Recent Labs  05/27/12 0412  GLUCAP 87    Scheduled Meds: . carbidopa-levodopa  1 tablet Oral 5 X Daily  . carbidopa-levodopa  0.5 tablet Oral Custom  . clonazePAM  0.5 mg Oral QHS  . enoxaparin (LOVENOX) injection  40 mg Subcutaneous Q24H  . sertraline  100 mg Oral Daily  . sodium chloride  3 mL Intravenous Q12H    Continuous Infusions:   Past Medical History  Diagnosis Date  . MVP (mitral valve prolapse) 2002    MR ,TR. SBE prophylaxis  . Dementia     Past Surgical History   Procedure Laterality Date  . Dilation and curettage of uterus    . Tonsillectomy    . Colonoscopy  2002    Negative    Clarene Duke RD, LDN Pager [phone] After Hours pager [phone]

## 2012-05-27 NOTE — Progress Notes (Signed)
Subjective: Patient denies complaints today she is eating in the recliner.  Husband at bedside   Objective: Vital signs in last 24 hours: Filed Vitals:   05/27/12 0300 05/27/12 0500 05/27/12 0606 05/27/12 1021  BP:  160/107 136/91 130/94  Pulse:  77  88  Temp: 99.4 F (37.4 C) 99 F (37.2 C)  98.4 F (36.9 C)  TempSrc: Rectal Oral  Oral  Resp:  18  20  Height:      Weight:  196 lb 6.9 oz (89.1 kg)    SpO2:  97%  98%   Weight change:   Intake/Output Summary (Last 24 hours) at 05/27/12 1300 Last data filed at 05/27/12 1023  Gross per 24 hour  Intake    420 ml  Output   1650 ml  Net  -1230 ml   General: sitting in recliner eating strawberry shortcake, NAD, alert and oriented x 1  HEENT: [place]/at no scleral icterus  Cardiac: RRR, no rubs, murmurs or gallops  Pulm: clear to auscultation bilaterally, no wheezes, rales, or rhonchi  Abd: soft, nontender, nondistended, BS present  Ext: warm and well perfused, no pedal edema  Neuro: alert and oriented X1, moving all 4 extremities, grossly neurologically intact     Lab Results: Basic Metabolic Panel:  Recent Labs Lab 05/26/12 1144 05/27/12 0456  NA 138 140  K 4.2 3.8  CL 107 106  CO2 19 20  GLUCOSE 115* 95  BUN 26* 26*  CREATININE 0.90 0.89  CALCIUM 9.2 9.2   Liver Function Tests:  Recent Labs Lab 05/27/12 0456  AST 33  ALT <5  ALKPHOS 48  BILITOT 0.9  PROT 6.0  ALBUMIN 3.5   CBC:  Recent Labs Lab 05/26/12 1144 05/27/12 0456  WBC 12.9* 13.8*  NEUTROABS 9.2*  --   HGB 11.5* 11.6*  HCT 33.4* 34.3*  MCV 94.1 95.5  PLT 175 197   BNP:  Recent Labs Lab 05/26/12 1145  PROBNP 2186.0*   CBG:  Recent Labs Lab 05/27/12 0412  GLUCAP 87   Urinalysis:  Recent Labs Lab 05/26/12 1220  COLORURINE YELLOW  LABSPEC 1.022  PHURINE 6.0  GLUCOSEU NEGATIVE  HGBUR NEGATIVE  BILIRUBINUR NEGATIVE  KETONESUR NEGATIVE  PROTEINUR NEGATIVE  UROBILINOGEN 0.2  NITRITE NEGATIVE  LEUKOCYTESUR NEGATIVE    Misc. Labs: None   Micro Results: Recent Results (from the past 240 hour(s))  CULTURE, BLOOD (ROUTINE X 2)     Status: None   Collection Time    05/26/12 11:50 AM      Result Value Range Status   Specimen Description BLOOD LEFT ANTECUBITAL   Final   Special Requests BOTTLES DRAWN AEROBIC ONLY 10CC   Final   Culture  Setup Time 05/26/2012 16:24   Final   Culture     Final   Value:        BLOOD CULTURE RECEIVED NO GROWTH TO DATE CULTURE WILL BE HELD FOR 5 DAYS BEFORE ISSUING A FINAL NEGATIVE REPORT   Report Status PENDING   Incomplete  CULTURE, BLOOD (ROUTINE X 2)     Status: None   Collection Time    05/26/12 12:00 PM      Result Value Range Status   Specimen Description BLOOD ARM RIGHT   Final   Special Requests     Final   Value: BOTTLES DRAWN AEROBIC AND ANAEROBIC BLUE 10CC RED 5CC   Culture  Setup Time 05/26/2012 16:23   Final   Culture     Final  Value:        BLOOD CULTURE RECEIVED NO GROWTH TO DATE CULTURE WILL BE HELD FOR 5 DAYS BEFORE ISSUING A FINAL NEGATIVE REPORT   Report Status PENDING   Incomplete  URINE CULTURE     Status: None   Collection Time    05/26/12 12:22 PM      Result Value Range Status   Specimen Description URINE, CATHETERIZED   Final   Special Requests NONE   Final   Culture  Setup Time 05/26/2012 13:17   Final   Colony Count NO GROWTH   Final   Culture NO GROWTH   Final   Report Status 05/27/2012 FINAL   Final  MRSA PCR SCREENING     Status: None   Collection Time    05/26/12 11:58 PM      Result Value Range Status   MRSA by PCR NEGATIVE  NEGATIVE Final   Comment:            The GeneXpert MRSA Assay (FDA     approved for NASAL specimens     only), is one component of a     comprehensive MRSA colonization     surveillance program. It is not     intended to diagnose MRSA     infection nor to guide or     monitor treatment for     MRSA infections.   Studies/Results: Dg Chest 1 View  05/26/2012   *RADIOLOGY REPORT*  Clinical Data:  Severe respiratory distress.  CHEST - 1 VIEW  Comparison: Two-view chest x-ray 06/11/2011.  Findings: Cardiac silhouette upper normal in size for AP technique, unchanged.  Thoracic aorta mildly atherosclerotic, unchanged. Hilar and mediastinal contours otherwise unremarkable.  Lungs clear.  Bronchovascular markings normal.  Pulmonary vascularity normal.  No pneumothorax.  No pleural effusions.  Thoracolumbar scoliosis convex left again noted.  IMPRESSION: No acute cardiopulmonary disease.   Original Report Authenticated By: Hulan Saas, M.D.   Ct Head Wo Contrast  05/26/2012   *RADIOLOGY REPORT*  Clinical Data: Shortness of breath, patient was found unresponsive.  CT HEAD WITHOUT CONTRAST  Technique:  Contiguous axial images were obtained from the base of the skull through the vertex without contrast.  Comparison: 06/11/2011  Findings:  Exam detail is diminished due to motion artifact.  There is diffuse patchy low density throughout the subcortical and periventricular white matter consistent with chronic small vessel ischemic change.  There is prominence of the sulci and ventricles consistent with brain atrophy.  The paranasal sinuses are clear.  The mastoid air cells are clear. The skull appears intact.  IMPRESSION:  1.  Exam detail diminish due to motion artifact. 2.  Small vessel ischemic change and brain atrophy.   Original Report Authenticated By: Signa Kell, M.D.   Dg Chest Port 1 View  05/27/2012   *RADIOLOGY REPORT*  Clinical Data: Fever  PORTABLE CHEST - 1 VIEW  Comparison: 05/26/2012  Findings: The heart and pulmonary vascularity are stable.  Old rib fractures are noted on the right.  The lungs are clear.  The scoliosis is again identified and stable.  IMPRESSION: No acute abnormality is noted.   Original Report Authenticated By: Alcide Clever, M.D.   Dg Swallowing Func-speech Pathology  05/27/2012   Breck Coons [place], CCC-SLP     05/27/2012 11:26 AM Objective Swallowing Evaluation: Modified  Barium Swallowing Study   Patient Details  Name: Melissa Pollard MRN: [id number] Date of Birth: 1940/03/29  Today's Date: 05/27/2012 Time: 0940-1000 SLP Time Calculation (  min): 20 min  Past Medical History:  Past Medical History  Diagnosis Date  . MVP (mitral valve prolapse) 2002    MR ,TR. SBE prophylaxis  . Dementia    Past Surgical History:  Past Surgical History  Procedure Laterality Date  . Dilation and curettage of uterus    . Tonsillectomy    . Colonoscopy  2002    Negative   HPI:  72 y.o PMH Parkinson's disease with dementia (diagnosed since  2002) presented to [facility] from from [facility] patient  found foaming at the mouth, gurgling, difficulty  breathing/shortness of breath. Her O2 sats were 88% on room air,  and her heart rate was 120s to 150s. EMS noted she desaturated to  the mid 80s to low 90s and they applied a nonrebreather.On  arrival to the [facility] ED she initially presented with some  respiratory distress and difficulty swallowing and altered mental  status (i.e she did open eyes to verbal stimuli and had an intact  gag reflex).  CXR 5/22 No acute cardiopulmonary disease and  repeat CXR 5/23 revealed No acute abnormality as well.  Review of  systems hard to obtain but she and her husband mention a  subjective fever, dysphagia (worsening x 2 years more acutely for  3 months eating a regular diet). Her husband states she has been  coughing/choking with eating prior to admission. Her baseline  dementia is worsening x 1 year and she has a history of visual  hallucinations. Other PMH includes CVA, HTN, DM.  She did not  pass the swallow study in the Emergency Room on admission.  Patient is eating a regular diet at the facility where she lives.  She is not on home O2. She is a resident of [facility]  nursing home since 01/2012      Assessment / Plan / Recommendation Clinical Impression  Dysphagia Diagnosis: Moderate oral phase dysphagia;Mild  pharyngeal phase dysphagia Clinical impression:  Pt. exhibited mild-moderate oral dysphagia  with slower mastication and manipulation with mechanical soft  texture.  First consistency provided was applesauce which took  pt. approximately 3-4 minutes to enter pt.'s UES ( moved from  posterior oral cavity to vallecuale then to pyriform sinuses)  likely due to Parkinson-like episode preventing timely  manipulation with first trial.  Subsequent initiation of po's  ranged from only 3-8 seconds.  Adequate airway protection without  any penetration or aspiration episodes exhibited.  Trace residue  in pyriform sinuses.  Therapeutic intervention needed to assist  with transit of puree including verbal cues and tactile cues with  dry spoon.  Pt. is at higher aspiration risk with Parkinson's.    SLP spoke to pt.'s husband who reported she eats a regular  texture diet at [facility].  recommends Dys 3 texture and  thin liquids.  Recommend pt. consume solids versus liquids at  begining of meal to assist with swallow initiation and  facilitation of motor pattern, pills crushed, straws ok and full  supervision.  He may require a downgrade in diet texture if  exhibiting difficutly since she is in a declined state at present  time.      Treatment Recommendation  Therapy as outlined in treatment plan below    Diet Recommendation Thin liquid;Dysphagia 3 (Mechanical Soft)   Liquid Administration via: Cup;Straw Medication Administration: Crushed with puree Supervision: Patient able to self feed;Full supervision/cueing  for compensatory strategies Compensations: Slow rate;Small sips/bites Postural Changes and/or Swallow Maneuvers: Seated upright 90  degrees    Other  Recommendations Oral Care Recommendations: Oral care BID   Follow Up Recommendations   (to be determined)    Frequency and Duration min 1 x/week  2 weeks   Pertinent Vitals/Pain none    SLP Swallow Goals Patient will utilize recommended strategies during swallow to  increase swallowing safety with: Maximal cueing    General HPI: 72 y.o PMH Parkinson's disease with dementia  (diagnosed since 2002) presented to [facility] from from [facility] patient found foaming at the mouth, gurgling, difficulty  breathing/shortness of breath. Her O2 sats were 88% on room air,  and her heart rate was 120s to 150s. EMS noted she desaturated to  the mid 80s to low 90s and they applied a nonrebreather.On  arrival to the [facility] ED she initially presented with some  respiratory distress and difficulty swallowing and altered mental  status (i.e she did open eyes to verbal stimuli and had an intact  gag reflex).  CXR 5/22 No acute cardiopulmonary disease and  repeat CXR 5/23 revealed No acute abnormality as well.  Review of  systems hard to obtain but she and her husband mention a  subjective fever, dysphagia (worsening x 2 years more acutely for  3 months eating a regular diet). Her husband states she has been  coughing/choking with eating prior to admission. Her baseline  dementia is worsening x 1 year and she has a history of visual  hallucinations. Other PMH includes CVA, HTN, DM.  She did not  pass the swallow study in the Emergency Room on admission.  Patient is eating a regular diet at the facility where she lives.  She is not on home O2. She is a resident of [facility]  nursing home since 01/2012  Type of Study: Modified Barium Swallowing Study Reason for Referral: Objectively evaluate swallowing function Previous Swallow Assessment: none Diet Prior to this Study: NPO Temperature Spikes Noted: Yes Respiratory Status: Room air History of Recent Intubation: No Behavior/Cognition: Alert;Confused;Distractible;Requires cueing Oral Cavity - Dentition: Adequate natural dentition Oral Motor / Sensory Function:  (generalized weakness) Self-Feeding Abilities: Able to feed self;Needs assist;Needs set  up Patient Positioning: Upright in chair Baseline Vocal Quality: Low vocal intensity Volitional Cough: Weak Anatomy:  (possible cervical  osteophytes) Pharyngeal Secretions: Not observed secondary MBS    Reason for Referral Objectively evaluate swallowing function   Oral Phase Oral Preparation/Oral Phase Oral Phase: Impaired Oral - Nectar Oral - Nectar Cup: Delayed oral transit Oral - Thin Oral - Thin Cup: Delayed oral transit Oral - Solids Oral - Puree: Delayed oral transit;Reduced posterior propulsion  (delayed prep, manipulation 1st trial) Oral - Mechanical Soft: Delayed oral transit;Weak lingual  manipulation;Impaired mastication   Pharyngeal Phase Pharyngeal Phase Pharyngeal Phase: Impaired Pharyngeal - Nectar Pharyngeal - Nectar Cup: Delayed swallow initiation;Premature  spillage to valleculae;Pharyngeal residue - pyriform  sinuses;Reduced tongue base retraction (trace) Pharyngeal - Thin Pharyngeal - Thin Cup: Pharyngeal residue - pyriform  sinuses;Reduced tongue base retraction;Delayed swallow  initiation;Premature spillage to pyriform sinuses (trace) Pharyngeal - Solids Pharyngeal - Puree: Delayed swallow initiation;Premature spillage  to pyriform sinuses  Cervical Esophageal Phase    GO    Cervical Esophageal Phase Cervical Esophageal Phase: Melissa Pollard         Darrow Bussing.Ed CCC-SLP Pager [phone]  05/27/2012   Medications:  Scheduled Meds: . carbidopa-levodopa  1 tablet Oral 5 X Daily  . carbidopa-levodopa  0.5 tablet Oral Custom  . clonazePAM  0.5 mg Oral  QHS  . enoxaparin (LOVENOX) injection  40 mg Subcutaneous Q24H  . sertraline  100 mg Oral Daily  . sodium chloride  3 mL Intravenous Q12H   Continuous Infusions:  PRN Meds:.sodium chloride, acetaminophen, acetaminophen, alum & mag hydroxide-simeth, clonazePAM, promethazine, sodium chloride  Assessment/Plan: 72 y.o PMH Parkinsons Disease presents with acute respiratory distress likely due to dysphagia.   1. Acute respiratory distress likely secondary to dysphagia  -Etiology likely due to oral pharyngeal dysphagia due to Parkinsons -Patient had modified barium swallow  ans was started on Dysphagia 3 diet.  Treatment includes food/liquid modifications, behavioral/environmental manipulation, positioning, strengthening exercises if those options fail then medical management (i.e feeding tube)  -saturating well on room air   2. History of Parkinson's Disease and dementia  -Possibly related to Lewy body dementia as the patient has hallucinations and fluctuations in cognition and alertness  -Worsening which can affect swallowing and ADLs as it is in this patient.  -She is on her home medications for PD and has an outpatient neurologist Dr. Lorin Picket at [facility].  -She is DNR/I  3. Leukocytosis  -Trending up. Tmax of 100.2. UA is negative. May be due to acute stress.  -will repeat CXR to w/u for aspiration which was negative again  4. Elevated blood pressure  -No history of hypertension. BP on admission 202/131 likely due to respiratory distress. Most recent BP 130/94 -Will monitor via telemetry   5. History of Depression/Anxiety  -Resumed home Klonopin 0.25-0.5 mg bid prn, 0.5 mg qhs, Zoloft 100 mg qd   6. F/E/N  -NSL  -will monitor and replace electrolytes prn  -dysphagia 3 diet   7. DVT px  -scds, Lovenox    Dispo: Likely d/c today to [facility]   The patient does have a current PCP at [facility] therefore will not be requiring OPC follow-up after discharge.   The patient does have transportation limitations that hinder transportation to clinic appointments.  .Services Needed at time of discharge: Y = Yes, Blank = No PT: Y  OT:   RN:   Equipment: None   Other: SLP    LOS: 1 day   Annett Gula [phone] 05/27/2012, 1:00 PM

## 2012-05-27 NOTE — Procedures (Signed)
Objective Swallowing Evaluation: Modified Barium Swallowing Study  Patient Details  Name: Melissa Pollard MRN: [id number] Date of Birth: 24-Jan-1940  Today's Date: 05/27/2012 Time: 0940-1000 SLP Time Calculation (min): 20 min  Past Medical History:  Past Medical History  Diagnosis Date  . MVP (mitral valve prolapse) 2002    MR ,TR. SBE prophylaxis  . Dementia    Past Surgical History:  Past Surgical History  Procedure Laterality Date  . Dilation and curettage of uterus    . Tonsillectomy    . Colonoscopy  2002    Negative   HPI:  72 y.o PMH Parkinson's disease with dementia (diagnosed since 2002) presented to [facility] from from [facility] patient found foaming at the mouth, gurgling, difficulty breathing/shortness of breath. Her O2 sats were 88% on room air, and her heart rate was 120s to 150s. EMS noted she desaturated to the mid 80s to low 90s and they applied a nonrebreather.On arrival to the [facility] ED she initially presented with some respiratory distress and difficulty swallowing and altered mental status (i.e she did open eyes to verbal stimuli and had an intact gag reflex).  CXR 5/22 No acute cardiopulmonary disease and repeat CXR 5/23 revealed No acute abnormality as well.  Review of systems hard to obtain but she and her husband mention a subjective fever, dysphagia (worsening x 2 years more acutely for 3 months eating a regular diet). Her husband states she has been coughing/choking with eating prior to admission. Her baseline dementia is worsening x 1 year and she has a history of visual hallucinations. Other PMH includes CVA, HTN, DM.  She did not pass the swallow study in the Emergency Room on admission. Patient is eating a regular diet at the facility where she lives. She is not on home O2. She is a resident of [facility] nursing home since 01/2012      Assessment / Plan / Recommendation Clinical Impression  Dysphagia Diagnosis: Moderate oral phase  dysphagia;Mild pharyngeal phase dysphagia Clinical impression: Pt. exhibited mild-moderate oral dysphagia with slower mastication and manipulation with mechanical soft texture.  First consistency provided was applesauce which took pt. approximately 3-4 minutes to enter pt.'s UES ( moved from posterior oral cavity to vallecuale then to pyriform sinuses) likely due to Parkinson-like episode preventing timely manipulation with first trial.  Subsequent initiation of po's ranged from only 3-8 seconds.  Adequate airway protection without any penetration or aspiration episodes exhibited.  Trace residue in pyriform sinuses.  Therapeutic intervention needed to assist with transit of puree including verbal cues and tactile cues with dry spoon.  Pt. is at higher aspiration risk with Parkinson's.   SLP spoke to pt.'s husband who reported she eats a regular texture diet at [facility].  recommends Dys 3 texture and thin liquids.  Recommend pt. consume solids versus liquids at begining of meal to assist with swallow initiation and facilitation of motor pattern, pills crushed, straws ok and full supervision.  He may require a downgrade in diet texture if exhibiting difficutly since she is in a declined state at present time.      Treatment Recommendation  Therapy as outlined in treatment plan below    Diet Recommendation Thin liquid;Dysphagia 3 (Mechanical Soft)   Liquid Administration via: Cup;Straw Medication Administration: Crushed with puree Supervision: Patient able to self feed;Full supervision/cueing for compensatory strategies Compensations: Slow rate;Small sips/bites Postural Changes and/or Swallow Maneuvers: Seated upright 90 degrees    Other  Recommendations Oral Care Recommendations: Oral care BID  Follow Up Recommendations   (to be determined)    Frequency and Duration min 1 x/week  2 weeks   Pertinent Vitals/Pain none    SLP Swallow Goals Patient will utilize recommended strategies  during swallow to increase swallowing safety with: Maximal cueing   General HPI: 72 y.o PMH Parkinson's disease with dementia (diagnosed since 2002) presented to [facility] from from [facility] patient found foaming at the mouth, gurgling, difficulty breathing/shortness of breath. Her O2 sats were 88% on room air, and her heart rate was 120s to 150s. EMS noted she desaturated to the mid 80s to low 90s and they applied a nonrebreather.On arrival to the [facility] ED she initially presented with some respiratory distress and difficulty swallowing and altered mental status (i.e she did open eyes to verbal stimuli and had an intact gag reflex).  CXR 5/22 No acute cardiopulmonary disease and repeat CXR 5/23 revealed No acute abnormality as well.  Review of systems hard to obtain but she and her husband mention a subjective fever, dysphagia (worsening x 2 years more acutely for 3 months eating a regular diet). Her husband states she has been coughing/choking with eating prior to admission. Her baseline dementia is worsening x 1 year and she has a history of visual hallucinations. Other PMH includes CVA, HTN, DM.  She did not pass the swallow study in the Emergency Room on admission. Patient is eating a regular diet at the facility where she lives. She is not on home O2. She is a resident of [facility] nursing home since 01/2012  Type of Study: Modified Barium Swallowing Study Reason for Referral: Objectively evaluate swallowing function Previous Swallow Assessment: none Diet Prior to this Study: NPO Temperature Spikes Noted: Yes Respiratory Status: Room air History of Recent Intubation: No Behavior/Cognition: Alert;Confused;Distractible;Requires cueing Oral Cavity - Dentition: Adequate natural dentition Oral Motor / Sensory Function:  (generalized weakness) Self-Feeding Abilities: Able to feed self;Needs assist;Needs set up Patient Positioning: Upright in chair Baseline Vocal Quality: Low vocal  intensity Volitional Cough: Weak Anatomy:  (possible cervical osteophytes) Pharyngeal Secretions: Not observed secondary MBS    Reason for Referral Objectively evaluate swallowing function   Oral Phase Oral Preparation/Oral Phase Oral Phase: Impaired Oral - Nectar Oral - Nectar Cup: Delayed oral transit Oral - Thin Oral - Thin Cup: Delayed oral transit Oral - Solids Oral - Puree: Delayed oral transit;Reduced posterior propulsion (delayed prep, manipulation 1st trial) Oral - Mechanical Soft: Delayed oral transit;Weak lingual manipulation;Impaired mastication   Pharyngeal Phase Pharyngeal Phase Pharyngeal Phase: Impaired Pharyngeal - Nectar Pharyngeal - Nectar Cup: Delayed swallow initiation;Premature spillage to valleculae;Pharyngeal residue - pyriform sinuses;Reduced tongue base retraction (trace) Pharyngeal - Thin Pharyngeal - Thin Cup: Pharyngeal residue - pyriform sinuses;Reduced tongue base retraction;Delayed swallow initiation;Premature spillage to pyriform sinuses (trace) Pharyngeal - Solids Pharyngeal - Puree: Delayed swallow initiation;Premature spillage to pyriform sinuses  Cervical Esophageal Phase    GO    Cervical Esophageal Phase Cervical Esophageal Phase: Leonarda Salon         Darrow Bussing.Ed [employer] [phone]  05/27/2012

## 2012-05-27 NOTE — H&P (Signed)
Internal Medicine Teaching Service Attending Note Date: 05/27/2012  Patient name: Melissa Pollard  Medical record number: [id number]  Date of birth: January 05, 1941   I have seen and evaluated Melissa Pollard and discussed their care with the Residency Team. Please see Dr Alford Highland H&P for full details. Melissa Pollard is a resident of [address] She has had a slow decline over about 2 yrs likely 2/2 parkinson's disease and its complications. Yesterday at the facility, she had sudden onset of acute resp distress. She was reported to have foaming at the mouth, gurgling, dyspnea, increased WOB, and hypoxia. EMS was called and O2 sat were mid 80's and she was put on NRB. In the ED, she was suctioned and improved rapidly. Other than changes that the husband observes when meds are delayed, he feels she is back to baseline. She has had chocking and coughing with eating but eats well and has a good appetite. He states she has lost about 50 lbs and was placed on Megace in Feb without weight increase. She has been seeing a neurologist at [facility] but "he has done all he can." She has no h/o asp PNA.   Physical Exam: Blood pressure 130/94, pulse 88, temperature 98.4 F (36.9 C), temperature source Oral, resp. rate 20, height 5\' 6"  (1.676 m), weight 196 lb 6.9 oz (89.1 kg), SpO2 98.00%. Gen : sitting in chair with legs drawn up. NAD. Speaks ocassional. Mouth open. NT / AT. Skin pale, no rashes. Moves all 4 extremities. EOMI.   Lab results: Results for orders placed during the hospital encounter of 05/26/12 (from the past 24 hour(s))  MRSA PCR SCREENING     Status: None   Collection Time    05/26/12 11:58 PM      Result Value Range   MRSA by PCR NEGATIVE  NEGATIVE  GLUCOSE, CAPILLARY     Status: None   Collection Time    05/27/12  4:12 AM      Result Value Range   Glucose-Capillary 87  70 - 99 mg/dL  COMPREHENSIVE METABOLIC PANEL     Status: Abnormal   Collection Time    05/27/12  4:56 AM      Result Value Range   Sodium 140  135 - 145 mEq/L   Potassium 3.8  3.5 - 5.1 mEq/L   Chloride 106  96 - 112 mEq/L   CO2 20  19 - 32 mEq/L   Glucose, Bld 95  70 - 99 mg/dL   BUN 26 (*) 6 - 23 mg/dL   Creatinine, Ser 2.13  0.50 - 1.10 mg/dL   Calcium 9.2  8.4 - 08.6 mg/dL   Total Protein 6.0  6.0 - 8.3 g/dL   Albumin 3.5  3.5 - 5.2 g/dL   AST 33  0 - 37 U/L   ALT <5  0 - 35 U/L   Alkaline Phosphatase 48  39 - 117 U/L   Total Bilirubin 0.9  0.3 - 1.2 mg/dL   GFR calc non Af Amer 63 (*) >90 mL/min   GFR calc Af Amer 73 (*) >90 mL/min  CBC     Status: Abnormal   Collection Time    05/27/12  4:56 AM      Result Value Range   WBC 13.8 (*) 4.0 - 10.5 K/uL   RBC 3.59 (*) 3.87 - 5.11 MIL/uL   Hemoglobin 11.6 (*) 12.0 - 15.0 g/dL   HCT 57.8 (*) 46.9 - 62.9 %   MCV 95.5  78.0 -  100.0 fL   MCH 32.3  26.0 - 34.0 pg   MCHC 33.8  30.0 - 36.0 g/dL   RDW 16.1  09.6 - 04.5 %   Platelets 197  150 - 400 K/uL    Imaging results:  Dg Chest 1 View  05/26/2012   *RADIOLOGY REPORT*  Clinical Data: Severe respiratory distress.  CHEST - 1 VIEW  Comparison: Two-view chest x-ray 06/11/2011.  Findings: Cardiac silhouette upper normal in size for AP technique, unchanged.  Thoracic aorta mildly atherosclerotic, unchanged. Hilar and mediastinal contours otherwise unremarkable.  Lungs clear.  Bronchovascular markings normal.  Pulmonary vascularity normal.  No pneumothorax.  No pleural effusions.  Thoracolumbar scoliosis convex left again noted.  IMPRESSION: No acute cardiopulmonary disease.   Original Report Authenticated By: Hulan Saas, M.D.   Ct Head Wo Contrast  05/26/2012   *RADIOLOGY REPORT*  Clinical Data: Shortness of breath, patient was found unresponsive.  CT HEAD WITHOUT CONTRAST  Technique:  Contiguous axial images were obtained from the base of the skull through the vertex without contrast.  Comparison: 06/11/2011  Findings:  Exam detail is diminished due to motion artifact.  There is diffuse patchy low density  throughout the subcortical and periventricular white matter consistent with chronic small vessel ischemic change.  There is prominence of the sulci and ventricles consistent with brain atrophy.  The paranasal sinuses are clear.  The mastoid air cells are clear. The skull appears intact.  IMPRESSION:  1.  Exam detail diminish due to motion artifact. 2.  Small vessel ischemic change and brain atrophy.   Original Report Authenticated By: Signa Kell, M.D.   Dg Chest Port 1 View  05/27/2012   *RADIOLOGY REPORT*  Clinical Data: Fever  PORTABLE CHEST - 1 VIEW  Comparison: 05/26/2012  Findings: The heart and pulmonary vascularity are stable.  Old rib fractures are noted on the right.  The lungs are clear.  The scoliosis is again identified and stable.  IMPRESSION: No acute abnormality is noted.   Original Report Authenticated By: Alcide Clever, M.D.   Dg Swallowing Func-speech Pathology  05/27/2012   Breck Coons [place], CCC-SLP     05/27/2012 11:26 AM Objective Swallowing Evaluation: Modified Barium Swallowing Study   Patient Details  Name: Melissa Pollard MRN: [id number] Date of Birth: [birth year]  Today's Date: 05/27/2012 Time: 0940-1000 SLP Time Calculation (min): 20 min  Past Medical History:  Past Medical History  Diagnosis Date  . MVP (mitral valve prolapse) 2002    MR ,TR. SBE prophylaxis  . Dementia    Past Surgical History:  Past Surgical History  Procedure Laterality Date  . Dilation and curettage of uterus    . Tonsillectomy    . Colonoscopy  2002    Negative   HPI:  72 y.o PMH Parkinson's disease with dementia (diagnosed since  2002) presented to [facility] from from [facility] patient  found foaming at the mouth, gurgling, difficulty  breathing/shortness of breath. Her O2 sats were 88% on room air,  and her heart rate was 120s to 150s. EMS noted she desaturated to  the mid 80s to low 90s and they applied a nonrebreather.On  arrival to the [facility] ED she initially presented with some  respiratory  distress and difficulty swallowing and altered mental  status (i.e she did open eyes to verbal stimuli and had an intact  gag reflex).  CXR 5/22 No acute cardiopulmonary disease and  repeat CXR 5/23 revealed No acute abnormality as well.  Review of  systems hard to obtain but she and her husband mention a  subjective fever, dysphagia (worsening x 2 years more acutely for  3 months eating a regular diet). Her husband states she has been  coughing/choking with eating prior to admission. Her baseline  dementia is worsening x 1 year and she has a history of visual  hallucinations. Other PMH includes CVA, HTN, DM.  She did not  pass the swallow study in the Emergency Room on admission.  Patient is eating a regular diet at the facility where she lives.  She is not on home O2. She is a resident of [facility]  nursing home since 01/2012      Assessment / Plan / Recommendation Clinical Impression  Dysphagia Diagnosis: Moderate oral phase dysphagia;Mild  pharyngeal phase dysphagia Clinical impression: Pt. exhibited mild-moderate oral dysphagia  with slower mastication and manipulation with mechanical soft  texture.  First consistency provided was applesauce which took  pt. approximately 3-4 minutes to enter pt.'s UES ( moved from  posterior oral cavity to vallecuale then to pyriform sinuses)  likely due to Parkinson-like episode preventing timely  manipulation with first trial.  Subsequent initiation of po's  ranged from only 3-8 seconds.  Adequate airway protection without  any penetration or aspiration episodes exhibited.  Trace residue  in pyriform sinuses.  Therapeutic intervention needed to assist  with transit of puree including verbal cues and tactile cues with  dry spoon.  Pt. is at higher aspiration risk with Parkinson's.    SLP spoke to pt.'s husband who reported she eats a regular  texture diet at [facility].  recommends Dys 3 texture and  thin liquids.  Recommend pt. consume solids versus liquids at   begining of meal to assist with swallow initiation and  facilitation of motor pattern, pills crushed, straws ok and full  supervision.  He may require a downgrade in diet texture if  exhibiting difficutly since she is in a declined state at present  time.      Treatment Recommendation  Therapy as outlined in treatment plan below    Diet Recommendation Thin liquid;Dysphagia 3 (Mechanical Soft)   Liquid Administration via: Cup;Straw Medication Administration: Crushed with puree Supervision: Patient able to self feed;Full supervision/cueing  for compensatory strategies Compensations: Slow rate;Small sips/bites Postural Changes and/or Swallow Maneuvers: Seated upright 90  degrees    Other  Recommendations Oral Care Recommendations: Oral care BID   Follow Up Recommendations   (to be determined)    Frequency and Duration min 1 x/week  2 weeks   Pertinent Vitals/Pain none    SLP Swallow Goals Patient will utilize recommended strategies during swallow to  increase swallowing safety with: Maximal cueing   General HPI: 72 y.o PMH Parkinson's disease with dementia  (diagnosed since 2002) presented to [facility] from from [facility] patient found foaming at the mouth, gurgling, difficulty  breathing/shortness of breath. Her O2 sats were 88% on room air,  and her heart rate was 120s to 150s. EMS noted she desaturated to  the mid 80s to low 90s and they applied a nonrebreather.On  arrival to the [facility] ED she initially presented with some  respiratory distress and difficulty swallowing and altered mental  status (i.e she did open eyes to verbal stimuli and had an intact  gag reflex).  CXR 5/22 No acute cardiopulmonary disease and  repeat CXR 5/23 revealed No acute abnormality as well.  Review of  systems hard to obtain but she and  her husband mention a  subjective fever, dysphagia (worsening x 2 years more acutely for  3 months eating a regular diet). Her husband states she has been  coughing/choking with eating prior  to admission. Her baseline  dementia is worsening x 1 year and she has a history of visual  hallucinations. Other PMH includes CVA, HTN, DM.  She did not  pass the swallow study in the Emergency Room on admission.  Patient is eating a regular diet at the facility where she lives.  She is not on home O2. She is a resident of [facility]  nursing home since 01/2012  Type of Study: Modified Barium Swallowing Study Reason for Referral: Objectively evaluate swallowing function Previous Swallow Assessment: none Diet Prior to this Study: NPO Temperature Spikes Noted: Yes Respiratory Status: Room air History of Recent Intubation: No Behavior/Cognition: Alert;Confused;Distractible;Requires cueing Oral Cavity - Dentition: Adequate natural dentition Oral Motor / Sensory Function:  (generalized weakness) Self-Feeding Abilities: Able to feed self;Needs assist;Needs set  up Patient Positioning: Upright in chair Baseline Vocal Quality: Low vocal intensity Volitional Cough: Weak Anatomy:  (possible cervical osteophytes) Pharyngeal Secretions: Not observed secondary MBS    Reason for Referral Objectively evaluate swallowing function   Oral Phase Oral Preparation/Oral Phase Oral Phase: Impaired Oral - Nectar Oral - Nectar Cup: Delayed oral transit Oral - Thin Oral - Thin Cup: Delayed oral transit Oral - Solids Oral - Puree: Delayed oral transit;Reduced posterior propulsion  (delayed prep, manipulation 1st trial) Oral - Mechanical Soft: Delayed oral transit;Weak lingual  manipulation;Impaired mastication   Pharyngeal Phase Pharyngeal Phase Pharyngeal Phase: Impaired Pharyngeal - Nectar Pharyngeal - Nectar Cup: Delayed swallow initiation;Premature  spillage to valleculae;Pharyngeal residue - pyriform  sinuses;Reduced tongue base retraction (trace) Pharyngeal - Thin Pharyngeal - Thin Cup: Pharyngeal residue - pyriform  sinuses;Reduced tongue base retraction;Delayed swallow  initiation;Premature spillage to pyriform sinuses (trace)  Pharyngeal - Solids Pharyngeal - Puree: Delayed swallow initiation;Premature spillage  to pyriform sinuses  Cervical Esophageal Phase    GO    Cervical Esophageal Phase Cervical Esophageal Phase: Leonarda Salon         Darrow Bussing.Ed CCC-SLP Pager [phone]  05/27/2012    Assessment and Plan: I agree with the formulated Assessment and Plan with the following changes:   1. Acute resp distress - resolved. Likely 2/2 dysphagia. She has been eval by speech and diet adjusted.   2. Leukocytosis - unknown cause. UA nl. 2 CXR nl. No temp. No ABX for now. Can cont to follow as outpt.  3. Parkinson's Husband states pharmacist had rec patch for her meds. I cannot find transdermal as an option.  4. Weight loss - rec stopping Megace as had no weight increase on it since Feb and it does have side effects.  OK D/C Brighten Gwendlyn Deutscher, MD 5/23/20141:19 PM

## 2012-05-27 NOTE — Progress Notes (Signed)
Patient's urinary catheter has been removed per protocol; will continue to monitor patient. Lorretta Harp RN

## 2012-05-27 NOTE — Progress Notes (Signed)
The patient arrived to 91.  The patient was oriented to the unit and placed on telemetry.  VS were taken and the patient was assessed.  The bed alarm was turned on and the call bell was placed within reach.  The patient does not have any complaints of pain at this time.

## 2012-05-27 NOTE — Progress Notes (Signed)
Speech Language Pathology  Patient Details Name: Melissa Pollard MRN: [id number] DOB: 09/06/1940 Today's Date: 05/27/2012 Time:  -    SLP reviewed chart.  Pt. Would benefit from an objective assessment with MBS versus swallow eval at bedside due to reports of consistent s/s aspiration prior to admission that appears to have worsened.  Her CXR yesterday was clear and presently xray tech is in room obtaining a 2nd CXR.  Will page MD for MBS order.  [name].Ed [employer] [phone]  05/27/2012

## 2012-05-27 NOTE — Care Management Note (Signed)
    Page 1 of 1   05/27/2012     1:43:48 PM   CARE MANAGEMENT NOTE 05/27/2012  Patient:  Melissa Pollard,Melissa Pollard   Account Number:  [id number]  Date Initiated:  05/27/2012  Documentation initiated by:  Oletta Cohn  Subjective/Objective Assessment:   72 y.o PMH Parkinson's disease with dementia (diagnosed since 2002) presented to [facility] from from [facility] after around 10 am noted they noted the pt to be foaming at the mouth, gurgling, difficulty breathing/SOB     Action/Plan:   SLP eval/ return to [facility]   Anticipated DC Date:  05/29/2012   Anticipated DC Plan:  ASSISTED LIVING / REST HOME      DC Planning Services  CM consult      Choice offered to / List presented to:             Status of service:   Medicare Important Message given?   (If response is "NO", the following Medicare IM given date fields will be blank) Date Medicare IM given:   Date Additional Medicare IM given:    Discharge Disposition:    Per UR Regulation:    If discussed at Long Length of Stay Meetings, dates discussed:    Comments:  05/27/12.Marland KitchenMarland KitchenOletta Cohn, RN, BSN, [place] [phone] Pt returning to [facility] where [facility] is set up through the establishment.  HH will be resumed through [facility].

## 2012-05-27 NOTE — Progress Notes (Signed)
PT Cancellation Note  Patient Details Name: Tacori Kvamme MRN: [id number] DOB: [birth year]   Cancelled Treatment:    Reason Eval/Treat Not Completed: Other (comment)  Per OT, no acute PT needs.  Patient to be discharged back to [facility] with f/u HHPT.   Vena Austria 05/27/2012, 4:26 PM Durenda Hurt. Renaldo Fiddler, [facility] Acute Rehab Services Pager [phone]

## 2012-05-27 NOTE — Progress Notes (Signed)
Utilization Review Completed Halen Antenucci J. Dawn Convery, RN, BSN, NCM [phone]  

## 2012-05-27 NOTE — Evaluation (Signed)
Occupational Therapy Evaluation and Discharge Patient Details Name: Kilynn Fitzsimmons MRN: [id number] DOB: 11-27-1940 Today's Date: 05/27/2012 Time: 4098-1191 OT Time Calculation (min): 39 min  OT Assessment / Plan / Recommendation Clinical Impression  This 72 yo female admitted Respiratory distress (h/o Parkinson's disease with dementia). At baseline walks with cane intermittently, can feed herself and shower herself occasionally but without consistently getting medications her mental status and physical capabiliites fluctuates per her husband. Pt was getting HHPT at [facility] and I feel that this is appropriate to continue with pt presenting with deficits below. Will D/C pt from OT and have let PT know that they do not necessarily need to see her and pt's husband is hopeful she will D/C back to [facility] today.     OT Assessment   (Pt needs continued PT services at [facility])    Follow Up Recommendations  No OT follow up       Equipment Recommendations  None recommended by OT          Precautions / Restrictions Precautions Precautions: Fall Precaution Comments: Parkinsons and dementia Restrictions Weight Bearing Restrictions: No       ADL  ADL Comments: Pt attempted to help me take off her dirty gown and then put her new gown on. Able to reach up and grab my name tag.        Visit Information  Last OT Received On: 05/27/12 Assistance Needed: +1 (for transfers)    Subjective Data  Subjective: "I guess"when I asked if it felt good to be up in the chair   Prior Functioning     Home Living Type of Home: Assisted living (memory care unit at [facility]) Prior Function Level of Independence: Needs assistance Comments: Assistance with BADLs and mobility varies from day to day even within a day per pt's husband due to pt's Parkinsons and timing of her meds         Vision/Perception Vision - History Patient Visual Report: No change from  baseline   Cognition  Cognition Arousal/Alertness: Awake/alert Behavior During Therapy: Anxious Overall Cognitive Status: History of cognitive impairments - at baseline    Extremity/Trunk Assessment Right Upper Extremity Assessment RUE ROM/Strength/Tone:  (moves spontaneously ) Left Upper Extremity Assessment LUE ROM/Strength/Tone:  (moves spontaneously)     Mobility Bed Mobility Bed Mobility: Supine to Sit;Sitting - Scoot to Edge of Bed (HOB all the way up) Supine to Sit: 2: Max assist;HOB elevated Sitting - Scoot to [place] of Bed: 2: Max assist (with pad) Transfers Transfers: Sit to Stand;Stand to Sit Details for Transfer Assistance: Pt was mod A sit to stand on the first attempt from the bed with posterior lean; on second attempt from recliner pt was Min A; stand to sit max A due to decreased control of descent.        Balance Static Sitting Balance Static Sitting - Balance Support: Feet supported;Bilateral upper extremity supported Static Sitting - Level of Assistance:  (min guard A ) Static Sitting - Comment/# of Minutes: 3 minutes   End of Session OT - End of Session Activity Tolerance: Patient tolerated treatment well Patient left: in chair;with family/visitor present       Evette Georges [phone] 05/27/2012, 11:31 AM

## 2012-05-27 NOTE — Progress Notes (Signed)
The patient was trying to get out of bed and was pulling on tubing.  MD notified.  An order for a safety sitter was obtained.

## 2012-05-27 NOTE — Progress Notes (Signed)
Patient's IV and telemetry has been discontinued and patient is being discharged back to [facility].  Lorretta Harp RN

## 2012-05-27 NOTE — Progress Notes (Signed)
The MD said to give the Sinemet in applesauce only and to hold all other medications until the swallow screen.

## 2012-05-28 NOTE — ED Provider Notes (Signed)
I saw and evaluated the patient, reviewed the resident's note and I agree with the findings and plan.   Maythe Deramo, MD 05/28/12 0701 

## 2012-06-01 LAB — CULTURE, BLOOD (ROUTINE X 2)
Culture: NO GROWTH
Culture: NO GROWTH

## 2012-06-01 NOTE — Progress Notes (Signed)
06/01/12 0700  OT G-codes **NOT FOR INPATIENT CLASS**  Functional Limitation Mobility: Walking and moving around  Mobility: Walking and Moving Around Current Status (W0981) CL  Mobility: Walking and Moving Around Goal Status (X9147) CL  Mobility: Walking and Moving Around Discharge Status [id number]) CL   Late entry for 05/27/12 Ignacia Palma, OTR/L [phone] 06/01/2012

## 2012-07-14 ENCOUNTER — Ambulatory Visit (INDEPENDENT_AMBULATORY_CARE_PROVIDER_SITE_OTHER): Payer: Medicare Other | Admitting: Neurology

## 2012-07-14 ENCOUNTER — Encounter: Payer: Self-pay | Admitting: Neurology

## 2012-07-14 VITALS — BP 124/79 | HR 63 | Temp 97.9°F | Ht 62.5 in | Wt 98.0 lb

## 2012-07-14 DIAGNOSIS — G20 Parkinson's disease: Secondary | ICD-10-CM

## 2012-07-14 DIAGNOSIS — F419 Anxiety disorder, unspecified: Secondary | ICD-10-CM

## 2012-07-14 DIAGNOSIS — F411 Generalized anxiety disorder: Secondary | ICD-10-CM

## 2012-07-14 DIAGNOSIS — F0280 Dementia in other diseases classified elsewhere without behavioral disturbance: Secondary | ICD-10-CM

## 2012-07-14 DIAGNOSIS — R443 Hallucinations, unspecified: Secondary | ICD-10-CM

## 2012-07-14 DIAGNOSIS — F329 Major depressive disorder, single episode, unspecified: Secondary | ICD-10-CM

## 2012-07-14 MED ORDER — CARBIDOPA-LEVODOPA ER 25-100 MG PO TBCR: EXTENDED_RELEASE_TABLET | ORAL | Status: DC

## 2012-07-14 MED ORDER — CARBIDOPA-LEVODOPA 25-100 MG PO TABS: ORAL_TABLET | ORAL | Status: DC

## 2012-07-14 NOTE — Patient Instructions (Addendum)
I think overall you are doing fairly well but I do want to suggest a few things today:  Remember to drink plenty of fluid, eat healthy meals and do not skip any meals. Try to eat protein with a every meal and eat a healthy snack such as fruit or nuts in between meals. Try to keep a regular sleep-wake schedule and try to exercise daily, particularly in the form of walking, 20-30 minutes a day, if you can.   Engage in social activities in your community and with your family and try to keep up with current events by reading the newspaper or watching the news.   As far as your medications are concerned, I would like to suggest taking Sinemet CR 6 times a day: 6, 10, 2, 4, 7, and MN. Sinemet IR: 1/2 pill 5 times a day: 6, 10, 2, 4, 7 PM.   As far as diagnostic testing: no new test.  I would like to see you back in 3 months, sooner if we need to. Please call us with any interim questions, concerns, problems, updates or refill requests.  Brett Canales is my clinical assistant and will answer any of your questions and relay your messages to me and also relay most of my messages to you.  Our phone number is [phone]. We also have an after hours call service for urgent matters and there is a physician on-call for urgent questions. For any emergencies you know to call 911 or go to the nearest emergency room.

## 2012-07-14 NOTE — Progress Notes (Signed)
Subjective:    Patient ID: Melissa Pollard is a 72 y.o. female.  HPI  Huston Foley, MD, PhD [facility] [address], [address] [address] [place], [place] [address]   Dear Dr. Lorin Picket,   I saw your patient, Melissa Pollard, upon your kind request in my neurologic clinic today for initial consultation of her Parkinson's disease. The patient is accompanied by her husband today. As you know, Melissa Pollard is a very pleasant 72 year old left-handed woman with an otherwise benign underlying medical history, who was diagnosed with Parkinson's disease in 2002, with symptoms dating back to 2001. Symptoms initially consisted of R sided symptoms including tremor. You have been seeing her since 2002. She was last seen by you on 12/10/2011, at which time he reduced her Requip and suggested speech therapy evaluation for dysphagia. Her MMSE was 9/30 at the time. He was advised her to take Sinemet CR 25-100 mg strength one tablet at 6:30, 9:30, 12:30, 3:30, 6:30 and 2 AM as well as Sinemet regular release half a pill at 6:30, 9:30, 12:30, 3:30, and Requip 0.5 mg 3 times a day at 6:30, 9:30, 12:30, and her Zoloft was increased to 100 mg once daily as well as her clonazepam was increased to 0.5 mg at night. She was advised to discontinue the use of Benadryl. She has had associated memory loss, depression, recurrent falls, poor sleep, anxiety, as well as hallucinations and dyskinesias. She has had no signs of impulse control disorder. She currently resides at [facility]. She was initially in the Assisted Living but was found wandering twice in 3 weeks.  She had participated in a research study for Parkinson's disease at [facility] in her early treatment. She has had more periods of rigidity and worsening anxiety and depression which resulted in 3 steps to the emergency room. She had an EEG done at [facility] on 05/13/2012 which showed diffuse slowing. She was admitted  to the hospital for one day from 05/26/2012 through 05/27/2012 and was diagnosed with acute respiratory distress secondary to dysphagia. A head CT was done on 05/26/2012:Exam detail diminish due to motion artifact. 2. Small vessel ischemic change and brain atrophy. She was placed on a soft mechanical diet. This was per speech pathology evaluation and recommendation. She had a chest x-ray which did not show any acute cardiopulmonary disease. Her oxygen saturations improved after suctioning.  She is now on Sinemet CR 25/100 mg 1 pill five times a day: 6, 10, 2, 4 and 7 and MN. Sinemet IR 25/100 mg is 1/2 pill at 7, 10, 2, 4.  Memory loss was notable for 3 years and hallucinations have been there for 2 1/2 to 3 years. Her Sx have been progressive in terms of memory and hallucinations for the past 6-12 months.  Her Past Medical History Is Significant For: Past Medical History  Diagnosis Date  . MVP (mitral valve prolapse) 2002    MR ,TR. SBE prophylaxis  . Dementia     Her Past Surgical History Is Significant For: Past Surgical History  Procedure Laterality Date  . Dilation and curettage of uterus    . Tonsillectomy    . Colonoscopy  2002    Negative    Her Family History Is Significant For: Family History  Problem Relation Age of Onset  . Stroke Father   . Hypertension Father   . Heart failure Father   . Asthma Father   . Diabetes Cousin   .  Stroke Mother   . Hypertension Mother   . Breast cancer Mother 69    Her Social History Is Significant For: History   Social History  . Marital Status: Married    Spouse Name: Vilma Prader    Number of Children: 2  . Years of Education: MA   Occupational History  . Retired    Social History Main Topics  . Smoking status: Former Smoker -- 0.20 packs/day for 25 years    Types: Cigarettes    Quit date: 01/06/1995  . Smokeless tobacco: Never Used  . Alcohol Use: No  . Drug Use: No  . Sexually Active: None   Other Topics Concern  . 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 carbidopa-levodopa (SINEMET CR) 25-100 MG per tablet Take 1 tablet by mouth 5 (five) times daily.      . carbidopa-levodopa (SINEMET CR) 25-100 MG per tablet Take 1 tablet by mouth at bedtime as needed.      . carbidopa-levodopa (SINEMET IR) 25-100 MG per tablet Take 0.5 tablets by mouth 4 (four) times daily.       . clonazePAM (KLONOPIN) 0.5 MG tablet Take 0.5 mg by mouth at bedtime.       . clonazePAM (KLONOPIN) 0.5 MG tablet Take 0.25-0.5 mg by mouth daily as needed for anxiety.      . feeding supplement (ENSURE COMPLETE) LIQD Take 237 mLs by mouth 2 (two) times daily between meals.  237 mL  11  . Flora-Q (FLORA-Q) CAPS Take 1 capsule by mouth daily.      Marland Kitchen ibuprofen (ADVIL,MOTRIN) 400 MG tablet Take 400 mg by mouth every 8 (eight) hours as needed for pain. Take with food      . lisinopril (PRINIVIL,ZESTRIL) 5 MG tablet Take 1 tablet by mouth daily.      . promethazine (PHENERGAN) 12.5 MG tablet Take 12.5 mg by mouth every 6 (six) hours as needed for nausea.      . sertraline (ZOLOFT) 50 MG tablet Take 100 mg by mouth daily. 2 by mouth daily      . carbidopa-levodopa (SINEMET IR) 25-100 MG per tablet Take 1/2 pill 5 times a day: 6 AM, 10 AM, 2 PM, 4 PM and 7 PM  90 tablet  5  . Carbidopa-Levodopa ER (SINEMET CR) 25-100 MG tablet controlled release Take 1 pill 6 times a day: 6 AM, 10 AM, 2 PM, 4 PM, 7 PM and MN  180 tablet  5  . [DISCONTINUED] Carbidopa-Levodopa ER (SINEMET CR) 25-100 MG tablet controlled release Take 1 pill 6 times a day: 6 AM, 10 AM, 2 PM, 4 PM, 7 PM and MN   180 tablet  5   No facility-administered encounter medications on file as of 07/14/2012.    Review of Systems  Constitutional: Positive for fatigue and unexpected weight change (weight loss).  HENT: Positive for trouble swallowing.   Cardiovascular: Positive for palpitations.  Gastrointestinal: Positive for constipation.  Endocrine: Positive for cold intolerance (feeling cold) and heat intolerance (feeling hot).  Flushing  Musculoskeletal: Positive for myalgias (aching muscles), joint swelling and arthralgias.       Cramps  Neurological: Positive for speech difficulty (slurred speech).       Memory loss, confusion, difficulty swallowing  Hematological: Bruises/bleeds easily.    Objective:  Neurologic Exam  Physical Exam Physical Examination:   Filed Vitals:   07/14/12 1002  BP: 124/79  Pulse: 63  Temp: 97.9 F (36.6 C)    General Examination: The patient is a very pleasant 72 y.o. female in no acute distress. She is thin and frail appearing.  HEENT: Normocephalic, atraumatic, pupils are equal, round and reactive to light and accommodation. Funduscopic exam is normal with sharp disc margins noted. Extraocular tracking shows moderate saccadic breakdown without nystagmus noted. There is limitation to entire gaze. There is moderate decrease in eye blink rate. Hearing is intact. Face is symmetric with moderate facial masking and normal facial sensation. There is no lip, neck or jaw tremor. She has mild neck dyskinesias. Neck is moderately rigid with intact passive ROM. There are no carotid bruits on auscultation. Oropharynx exam reveals moderate mouth dryness. No significant airway crowding is noted. Mallampati is class II. Tongue protrudes centrally and palate elevates symmetrically.   There is no drooling.   Chest: is clear to auscultation without wheezing, rhonchi or crackles noted.  Heart: sounds are regular and normal without murmurs, rubs or gallops noted.   Abdomen: is  soft, non-tender and non-distended with normal bowel sounds appreciated on auscultation.  Extremities: There is no pitting edema in the distal lower extremities bilaterally. Pedal pulses are intact. Chronic stasis-like changes are noted in the distal legs bilaterally. There are no varicose veins. She has multiple old-appearing bruises. She had an area that broke open on her skin which was bandaged.  Skin: is warm and dry with no trophic changes noted. Age-related changes are noted on the skin.   Musculoskeletal: exam reveals no obvious joint deformities, tenderness, joint swelling or erythema.  Neurologically:  Mental status: The patient is awake and alert, paying fair  attention. She is unable to provide the history. Her family provides the entire history. She is oriented to: person, place and situation. Her memory, attention, language and knowledge are impaired. There is no aphasia, agnosia, apraxia or anomia. There is a moderate degree of bradyphrenia. Speech is moderately hypophonic with mild dysarthria noted. Mood is congruent and affect is normal.    Cranial nerves are as described above under HEENT exam. In addition, shoulder shrug is normal with equal shoulder height noted.  Motor exam: Normal bulk, and strength for age is noted. There are mild dyskinesias noted. These are primarily noted in the Upper body. They are intermittent..  Tone is moderately rigid with presence of cogwheeling in the right upper extremity. There is overall mild bradykinesia. There is no drift or rebound.  There is no tremor.   Romberg is not tested for fear of falling.  Reflexes are 1+ in the upper extremities and 1+ in the lower extremities.   Fine motor skills exam: Finger taps are severely impaired on the right and moderately impaired on the left. Hand movements are moderately impaired on the right and moderately impaired on the left. RAP (rapid alternating patting) is moderately impaired on the right and  moderately impaired on the left. Foot taps are severely impaired on the right and severely impaired on the left. Foot agility (in the form of heel stomping) is moderately impaired on the right and mildly impaired on  the left.    Cerebellar testing shows no dysmetria or intention tremor on finger to nose testing. Heel to shin is unremarkable bilaterally. There is no truncal or gait ataxia.   Sensory exam is intact to light touch, pinprick, vibration, temperature sense and proprioception in the upper and lower extremities.   Gait, station and balance: He stands up from the seated position with moderate difficulty and does not need to push up with Her hands. She needs little assistance. No veering to one side is noted. She is noted to lean to the right while sitting and standing. She has significant kyphoscoliosis and posture is moderately stooped. Stance is wide-based. She walks with decrease in stride length and pace and decreased arm swing on the right. She turns in 3 steps. Tandem walk is not possible. Balance is moderately impaired. She is not able to do a toe or heel stance.      Assessment and Plan:   Assessment and Plan:  In summary, Melissa Pollard is a very pleasant 72 y.o.-year old female with a history of right-sided predominant advanced Parkinson's disease, associated with motor complications as well as memory loss, hallucinations, episodes of confusion, and more significant overall decline including weight loss and fragility of the past year. She has recently transitioned from her own home into a nursing home situation and that was quite a bit of a setback per family report. Nevertheless, at this time they feel that she has done well. She has a palliative nurse involved in her care which has been a very helpful addition. I had a very long chat with her and particularly her family today about her advanced Parkinson's disease and the complications she is facing at this moment. While I do not  suggest rocking the boat too much I did suggest streamlining her medications to Sinemet CR one pill 6 times a day, namely and 6, 10, 2, 4, 7, and midnight. I would also recommend Sinemet immediate release half a pill 5 times a day, namely at 6, 10, 2, 4, and 7. Everything else will remain the same. I would like to reevaluate her in 3 months from now, sooner if the need arises and provided him with prescriptions as well as a consultation performed but will go back to her nursing home. I encouraged him to call with any interim questions, concerns, problems or updates and refill requests.   Thank you very much for allowing me to participate in the care of this nice patient. If I can be of any further assistance to you please do not hesitate to call me at [phone].  Sincerely,   Huston Foley, MD, PhD

## 2012-08-10 ENCOUNTER — Other Ambulatory Visit: Payer: Self-pay

## 2012-10-20 ENCOUNTER — Ambulatory Visit (INDEPENDENT_AMBULATORY_CARE_PROVIDER_SITE_OTHER): Payer: Medicare Other | Admitting: Neurology

## 2012-10-20 ENCOUNTER — Encounter: Payer: Self-pay | Admitting: Neurology

## 2012-10-20 VITALS — BP 142/84 | HR 73 | Temp 98.9°F | Ht 62.5 in | Wt 100.0 lb

## 2012-10-20 DIAGNOSIS — F0281 Dementia in other diseases classified elsewhere with behavioral disturbance: Secondary | ICD-10-CM

## 2012-10-20 DIAGNOSIS — F329 Major depressive disorder, single episode, unspecified: Secondary | ICD-10-CM

## 2012-10-20 DIAGNOSIS — G20 Parkinson's disease: Secondary | ICD-10-CM

## 2012-10-20 DIAGNOSIS — F411 Generalized anxiety disorder: Secondary | ICD-10-CM

## 2012-10-20 DIAGNOSIS — F419 Anxiety disorder, unspecified: Secondary | ICD-10-CM

## 2012-10-20 DIAGNOSIS — R443 Hallucinations, unspecified: Secondary | ICD-10-CM
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
## 2012-10-20 NOTE — Patient Instructions (Addendum)
I think overall you are doing fairly well but I do want to suggest a few things today:  Remember to drink plenty of fluid, eat healthy meals and do not skip any meals. Try to eat protein with a every meal and eat a healthy snack such as fruit or nuts in between meals. Try to keep a regular sleep-wake schedule and try to exercise daily, particularly in the form of walking, 10-20 minutes a day, if you can. Use your cane at all times.   Engage in social activities in your community and with your family and try to keep up with current events by reading the newspaper or watching the news.   As far as your medications are concerned, I would like to suggest no changes.   As far as diagnostic testing: no new test.   I would like to see you back in 4 months, sooner if we need to. Please call us with any interim questions, concerns, problems, updates or refill requests.  Please also call us for any test results so we can go over those with you on the phone. Brett Canales is my clinical assistant and will answer any of your questions and relay your messages to me and also relay most of my messages to you.  Our phone number is [phone]. We also have an after hours call service for urgent matters and there is a physician on-call for urgent questions. For any emergencies you know to call 911 or go to the nearest emergency room.

## 2012-10-25 ENCOUNTER — Telehealth: Payer: Self-pay | Admitting: Neurology

## 2012-10-25 NOTE — Telephone Encounter (Signed)
Please ask them to ask facility doctor to give them the order for this. This was a recommendation for the treatment Dr. on site.

## 2012-10-26 NOTE — Telephone Encounter (Signed)
Called and lt VM message for [facility] per Dr Teofilo Pod notes below

## 2012-10-28 NOTE — Progress Notes (Addendum)
Late entry SLP addendum   05/27/12 1126  SLP G-Codes **NOT FOR INPATIENT CLASS**  Functional Assessment Tool Used clincal judgement  Functional Limitations Swallowing  Swallow Current Status (Y7829) CJ  Swallow Goal Status (F6213) CI  Swallow Discharge Status (Y8657) CJ  SLP Evaluations  $ SLP Speech Visit 1 Procedure  SLP Evaluations  $MBS Swallow 1 Procedure  $Swallowing Treatment 1 Procedure   Breck Coons Guiselle Mian M.Ed [employer] [phone]

## 2012-11-10 ENCOUNTER — Other Ambulatory Visit: Payer: Self-pay

## 2013-01-25 IMAGING — US US ABDOMEN COMPLETE
1 series · 14 of 25 positions shown · non-contrast
Comparison: None.

CLINICAL DATA: Abdominal pain.

COMPLETE ABDOMINAL ULTRASOUND

[Series 1: us abdomen complete · 0.26mm/px · 14 of 75 slices shown]
[im 1/75]
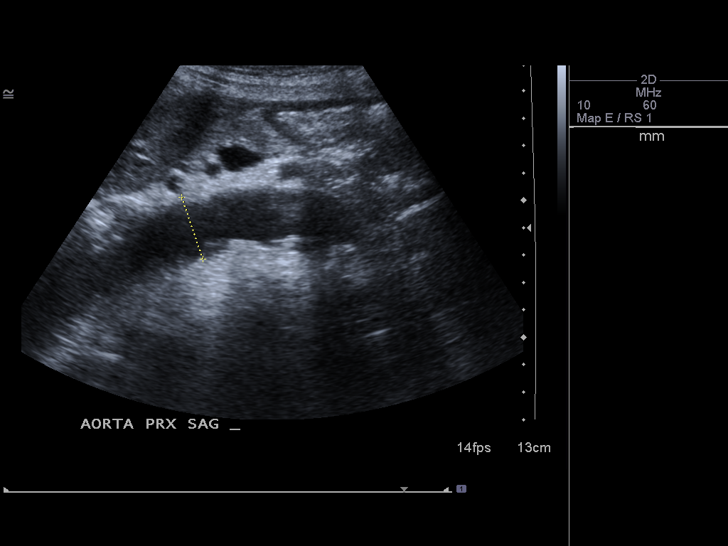
[im 7/75]
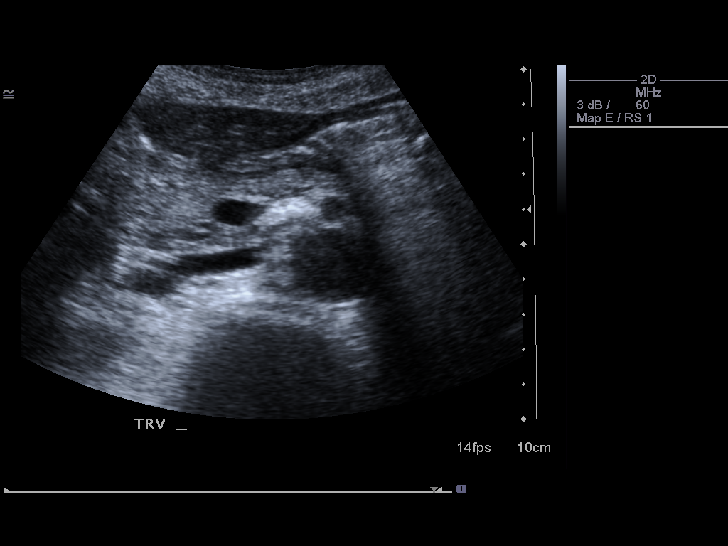
[im 13/75]
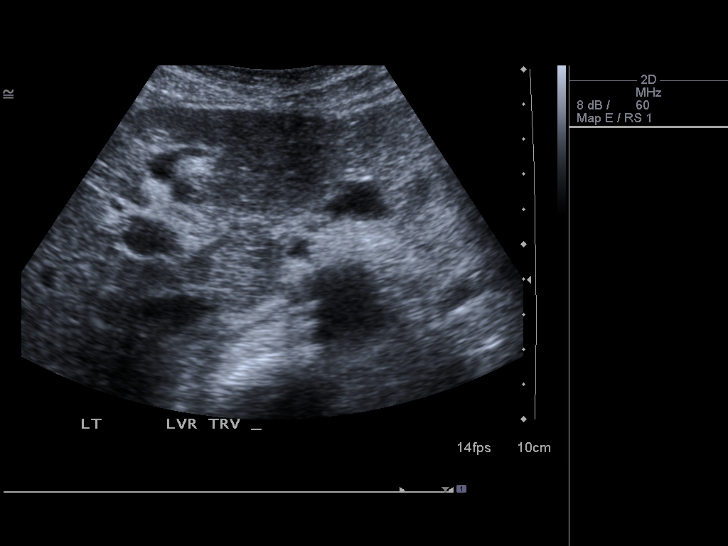
[im 19/75]
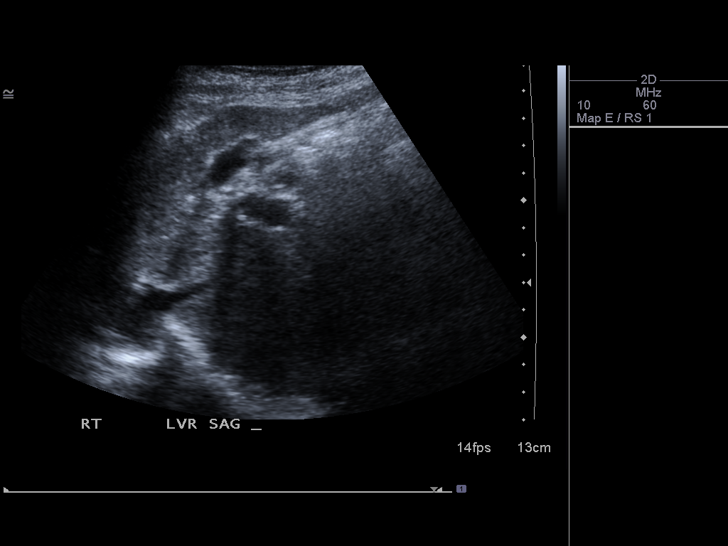
[im 25/75]
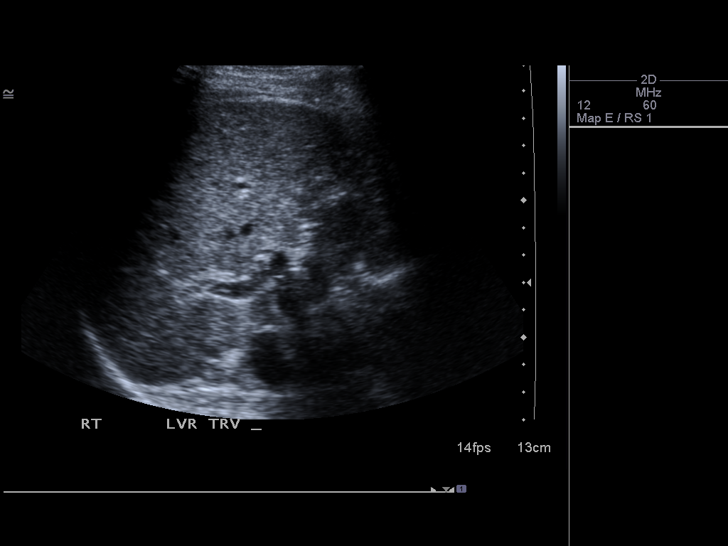
[im 28/75]
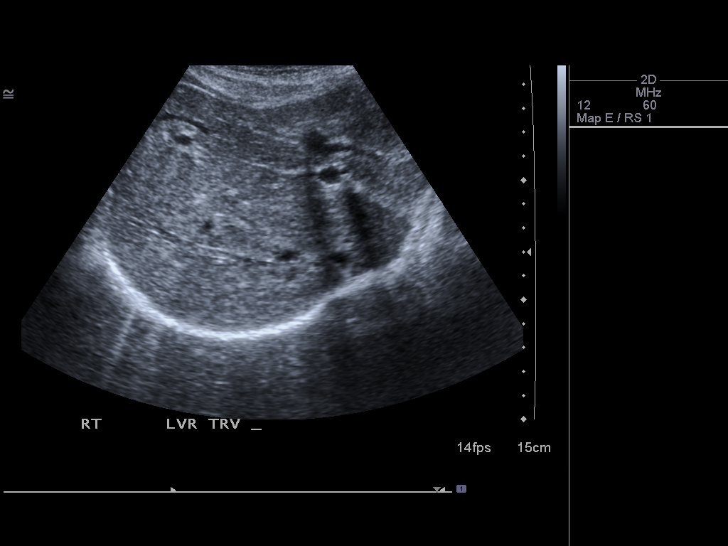
[im 34/75]
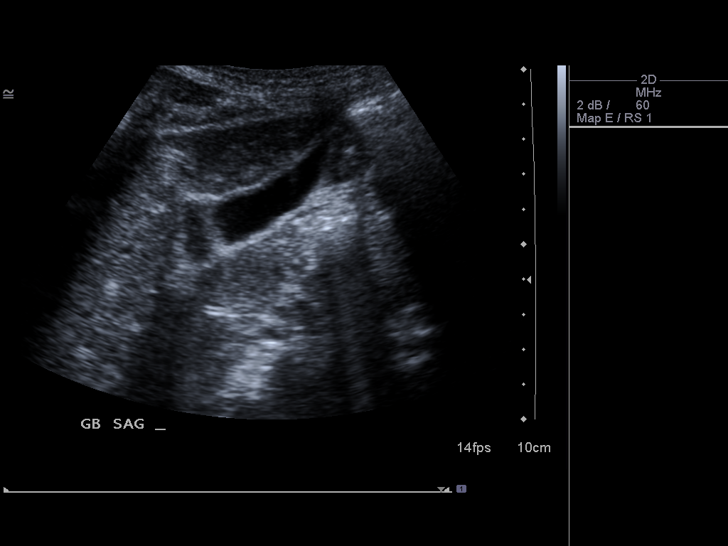
[im 41/75]
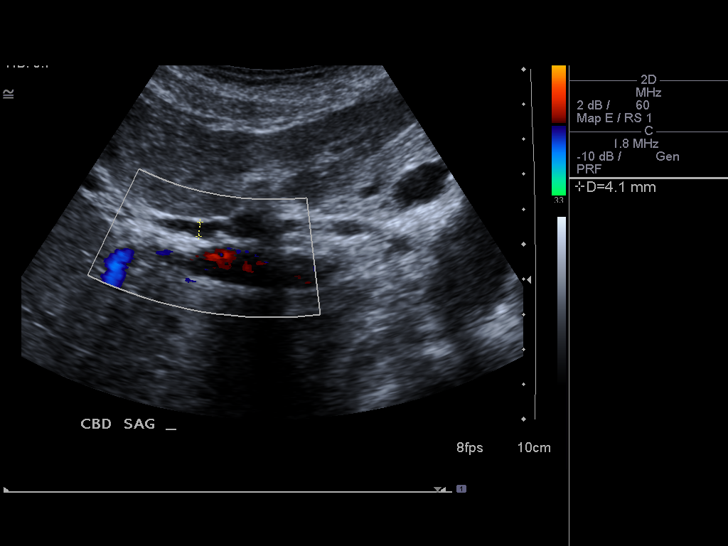
[im 47/75]
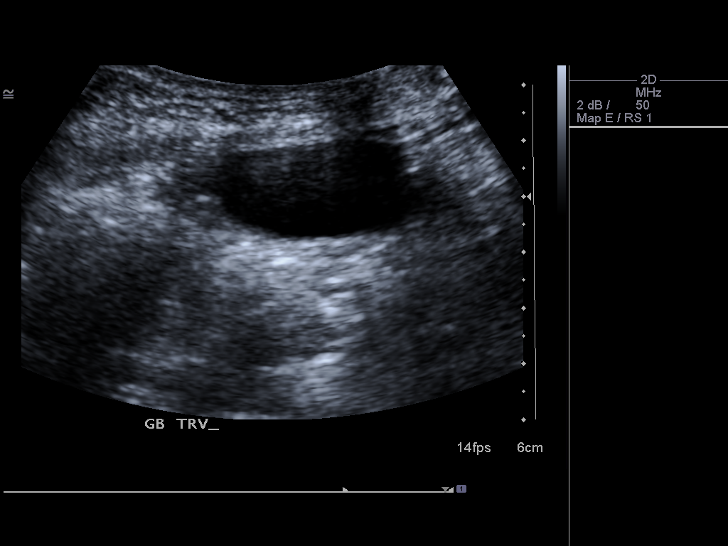
[im 50/75]
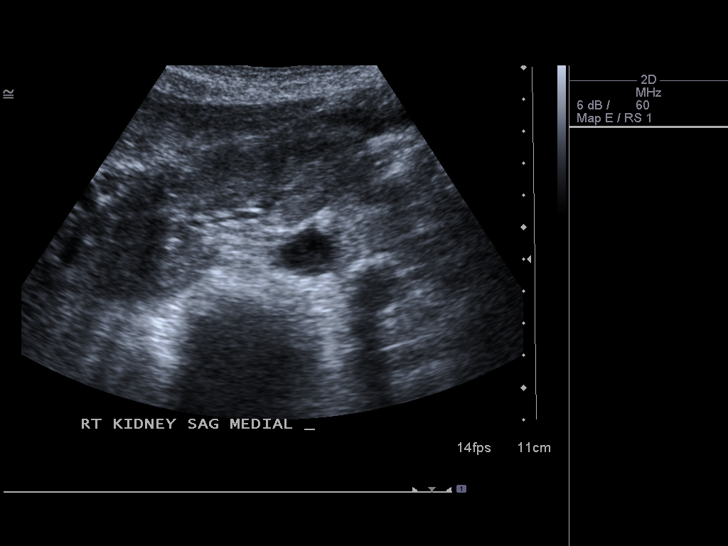
[im 56/75]
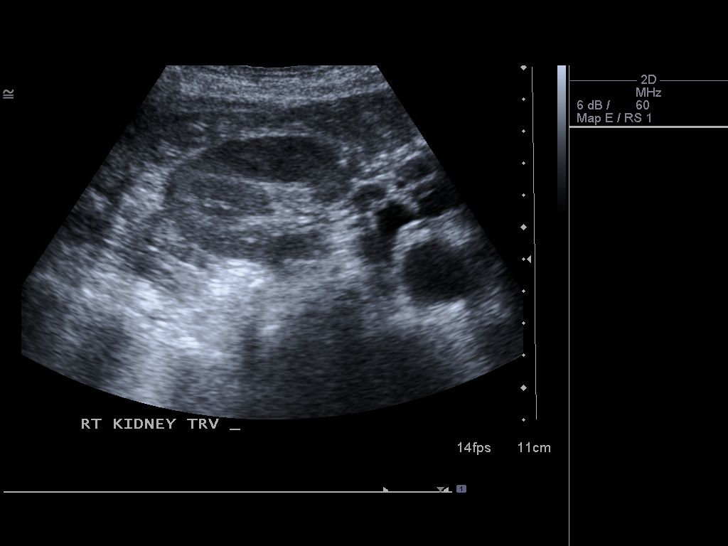
[im 62/75]
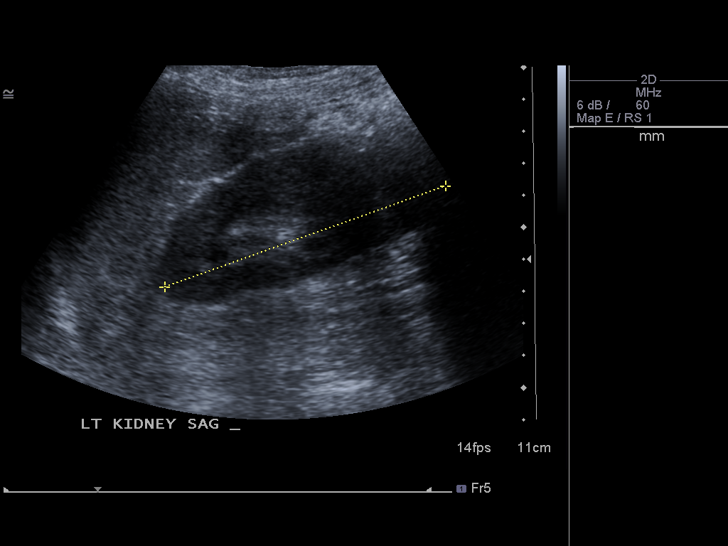
[im 68/75]
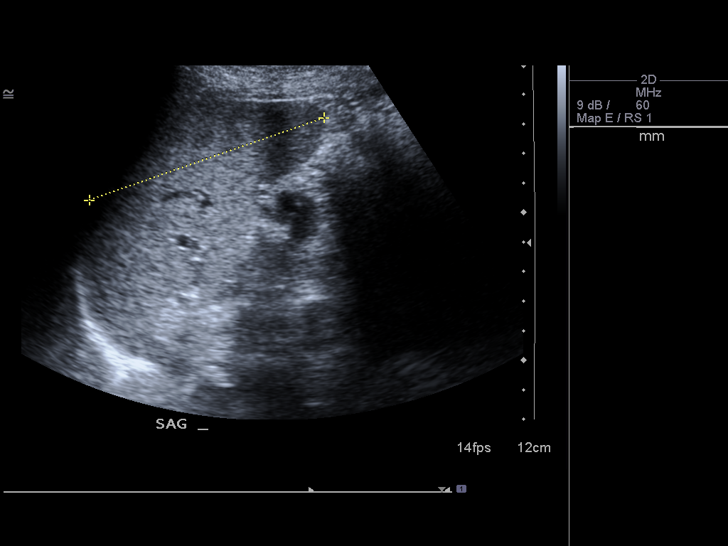
[im 75/75]
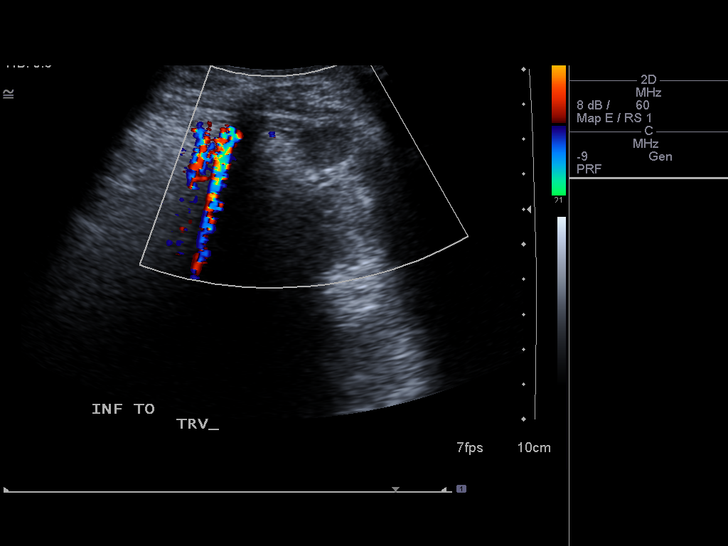

[14 of 25 positions shown; findings below may reference images not displayed]

FINDINGS: Gallbladder:  11 mm gallstone.  No wall thickening or sonographic
Murphy's sign.

Common bile duct:   Normal caliber, 4 mm.

Liver:  Heterogeneous echotexture.  2.0 cm hypoechoic area in the
left hepatic lobe, likely minimally complex cyst.

IVC:  Appears normal.

Pancreas:  No focal abnormality seen.

Spleen:  Within normal limits in size and echotexture.

Right Kidney:   Normal in size and parenchymal echogenicity.  No
evidence of mass or hydronephrosis.

Left Kidney:  Normal in size and parenchymal echogenicity.  No
evidence of mass or hydronephrosis.

Abdominal aorta:  No aneurysm identified.
IMPRESSION: Cholelithiasis.  No sonographic evidence of acute cholecystitis.

2 cm hypoechoic area in the left hepatic lobe, suspect minimally
complex cyst.

## 2013-02-22 ENCOUNTER — Telehealth: Payer: Self-pay | Admitting: Neurology

## 2013-02-22 NOTE — Telephone Encounter (Signed)
FYI--Patient has appt on Friday and husband wanted Dr. [name] to know some things before her appt-he states she is more confused and he caught her urinating in corner of bathroom--said she did not know what commode was for--[facility] does come to check on patient.

## 2013-02-24 ENCOUNTER — Ambulatory Visit (INDEPENDENT_AMBULATORY_CARE_PROVIDER_SITE_OTHER): Admitting: Neurology

## 2013-02-24 ENCOUNTER — Encounter: Payer: Self-pay | Admitting: Neurology

## 2013-02-24 VITALS — BP 135/83 | HR 78 | Temp 97.3°F | Ht 62.5 in

## 2013-02-24 DIAGNOSIS — R413 Other amnesia: Secondary | ICD-10-CM

## 2013-02-24 DIAGNOSIS — G20A1 Parkinson's disease without dyskinesia, without mention of fluctuations: Secondary | ICD-10-CM

## 2013-02-24 DIAGNOSIS — R443 Hallucinations, unspecified: Secondary | ICD-10-CM

## 2013-02-24 DIAGNOSIS — F3289 Other specified depressive episodes: Secondary | ICD-10-CM

## 2013-02-24 DIAGNOSIS — F329 Major depressive disorder, single episode, unspecified: Secondary | ICD-10-CM

## 2013-02-24 DIAGNOSIS — G20 Parkinson's disease: Secondary | ICD-10-CM

## 2013-02-24 DIAGNOSIS — Z9181 History of falling: Secondary | ICD-10-CM

## 2013-02-24 DIAGNOSIS — F0281 Dementia in other diseases classified elsewhere with behavioral disturbance: Secondary | ICD-10-CM

## 2013-02-24 DIAGNOSIS — F411 Generalized anxiety disorder: Secondary | ICD-10-CM

## 2013-02-24 DIAGNOSIS — F32A Depression, unspecified: Secondary | ICD-10-CM

## 2013-02-24 DIAGNOSIS — F419 Anxiety disorder, unspecified: Secondary | ICD-10-CM

## 2013-02-24 DIAGNOSIS — F02818 Dementia in other diseases classified elsewhere, unspecified severity, with other behavioral disturbance: Secondary | ICD-10-CM

## 2013-02-24 MED ORDER — MEMANTINE HCL ER 7 MG PO CP24: 7.0000 mg | ORAL_CAPSULE | Freq: Every day | ORAL | Status: DC
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
## 2013-02-24 NOTE — Patient Instructions (Addendum)
We will try you on low dose Namenda XR 7 mg strength for your memory.  We may be able to do a gradual build up. Side effects include: nausea, confusion, hallucination, personality changes. If you are having mild side effects, try to stick with the treatment as these initial side effects may go away after the first 10-14 days.   Call us back in about 1 month to give us an update.   Please have PT try you with a walker. I do not think the cane is helping you at this point.

## 2013-03-15 ENCOUNTER — Telehealth: Payer: Self-pay | Admitting: Neurology

## 2013-03-15 NOTE — Telephone Encounter (Signed)
Patient calling stating that the Namenda XR that was given to her 3 weeks ago is upsetting her stomach so she stop the medication because she is vomiting and feels bad. Patient would like Dr. [name] to call her back. Please advise.

## 2013-03-15 NOTE — Telephone Encounter (Signed)
Please advise patient to stay off Namenda and see if symptoms improve in the next few days.

## 2013-03-15 NOTE — Telephone Encounter (Signed)
Patient was seen 3 weeks ago and was given Rx for Namenda XR7--has stopped medication because she has upset stomach, vomiting and feels bad--please call--thank you.

## 2013-03-15 NOTE — Telephone Encounter (Signed)
Shared message below per Dr [name] thru VM message

## 2013-07-11 ENCOUNTER — Encounter: Payer: Self-pay | Admitting: Neurology

## 2013-07-11 ENCOUNTER — Ambulatory Visit (INDEPENDENT_AMBULATORY_CARE_PROVIDER_SITE_OTHER): Payer: Medicare Other | Admitting: Neurology

## 2013-07-11 VITALS — BP 149/91 | HR 90 | Temp 99.0°F | Ht 62.5 in | Wt 107.0 lb

## 2013-07-11 DIAGNOSIS — R443 Hallucinations, unspecified: Secondary | ICD-10-CM

## 2013-07-11 DIAGNOSIS — F0281 Dementia in other diseases classified elsewhere with behavioral disturbance: Secondary | ICD-10-CM

## 2013-07-11 DIAGNOSIS — F3289 Other specified depressive episodes: Secondary | ICD-10-CM

## 2013-07-11 DIAGNOSIS — F32A Depression, unspecified: Secondary | ICD-10-CM

## 2013-07-11 DIAGNOSIS — Z9181 History of falling: Secondary | ICD-10-CM

## 2013-07-11 DIAGNOSIS — G20A1 Parkinson's disease without dyskinesia, without mention of fluctuations: Secondary | ICD-10-CM

## 2013-07-11 DIAGNOSIS — F329 Major depressive disorder, single episode, unspecified: Secondary | ICD-10-CM

## 2013-07-11 DIAGNOSIS — F02818 Dementia in other diseases classified elsewhere, unspecified severity, with other behavioral disturbance: Secondary | ICD-10-CM

## 2013-07-11 DIAGNOSIS — G20 Parkinson's disease: Secondary | ICD-10-CM

## 2013-07-11 NOTE — Patient Instructions (Signed)
We will change the timing of your sinemet CR and sinemet IR, but leave the dosing the same.  I will see you in 4 months.
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
## 2013-07-25 ENCOUNTER — Telehealth: Payer: Self-pay | Admitting: Neurology

## 2013-07-25 NOTE — Telephone Encounter (Signed)
Dr. Redmond Schoolripp called wanting to speak with Dr. [name] concerning pt. I informed Dr. Redmond Schoolripp that Dr. [name] was not in the office today and if he would like to speak with our WID. Dr. Redmond Schoolripp stated that he would like to speak with Dr. [name] and that he would wait until she returns to the office. Dr. Redmond Schoolripp stated that Dr. [name] could reach him at [phone].

## 2013-12-12 ENCOUNTER — Ambulatory Visit: Payer: Medicare Other | Admitting: Neurology

## 2014-07-02 ENCOUNTER — Other Ambulatory Visit: Payer: Self-pay

## 2014-10-19 ENCOUNTER — Encounter: Payer: Self-pay | Admitting: Neurology

## 2014-10-19 ENCOUNTER — Ambulatory Visit (INDEPENDENT_AMBULATORY_CARE_PROVIDER_SITE_OTHER): Payer: Medicare Other | Admitting: Neurology

## 2014-10-19 VITALS — BP 126/76 | HR 87 | Wt 113.0 lb

## 2014-10-19 DIAGNOSIS — R441 Visual hallucinations: Secondary | ICD-10-CM | POA: Diagnosis not present

## 2014-10-19 DIAGNOSIS — G249 Dystonia, unspecified: Secondary | ICD-10-CM

## 2014-10-19 DIAGNOSIS — K117 Disturbances of salivary secretion: Secondary | ICD-10-CM | POA: Diagnosis not present

## 2014-10-19 DIAGNOSIS — G20 Parkinson's disease: Secondary | ICD-10-CM

## 2014-10-19 DIAGNOSIS — F0281 Dementia in other diseases classified elsewhere with behavioral disturbance: Secondary | ICD-10-CM

## 2014-10-19 MED ORDER — CARBIDOPA-LEVODOPA ER 50-200 MG PO TBCR: 1.0000 | EXTENDED_RELEASE_TABLET | Freq: Every day | ORAL | Status: AC

## 2014-10-19 NOTE — Progress Notes (Signed)
Note faxed to Dr Leanor RubensteinHaber.

## 2014-10-19 NOTE — Patient Instructions (Signed)
1. Discontinue Mirapex 2. Start Carbidopa Levodopa 50/200 at night.

## 2014-10-19 NOTE — Progress Notes (Signed)
Melissa Pollard was seen today in the movement disorders clinic for neurologic consultation at the request of HABER, Elon JesterMICHELE, MD.  The consultation is for the evaluation of PD.  The patient presents today, accompanied by her husband who supplements the history.  I have some medical records available to me.  The patient has seen Dr. Jerold CoombeBurton Scott as well as Dr. [name] previously.  She first saw Dr Lorin PicketScott in April, 2003 for PD according to records. First sx was R hand/leg tremor. She last saw Dr. Lorin PicketScott in 2013, when the drive from [place] just became too much for her.  At that time, she was having visual and auditory hallucinations along with some motor fluctuations.  She was on Benadryl at the time, so he was trying to limit that medication given its anticholinergic side effects.  She transferred to Dr. [name] and was under her care from July, 2014 to July, 2015.  As far as I can tell, she has not been under the care of a movement disorder neurologist since then.  The biggest issues at this point in time continue to be memory issues as well as significant motor fluctuations, consisting of on/off.  I talked to Dr. Leanor RubensteinHaber and she stated that this on/off phenomenon can be quite pronounced.   When she is off, she has significant rigidity and is virtually unable to move, but when she is on, she is actually able to get up and walk around. Her husband doesn't have this perception but admits he isn't around that much.  He doesn't think that freezing is all that often.  She is currently on carbidopa/levodopa 25/250 at 6am/9am/12pm/3pm and mirapex 0.125 mg at 8am/2pm/6pm.  Her husband does describe dyskinesia, which can be so significant that she can barely stay in the chair.  Specific Symptoms:  Tremor: Yes.  , husband thinks it can be either hand or either leg Voice: rarely if ever speaks and she "has her own language." Sleep: wanders in the middle of the night  Vivid Dreams:  No.  Acting out dreams:  Used to in the  past Wet Pillows: Yes.   Postural symptoms:  Yes.    Falls?  Yes.  , 1-2 times a week, no serious injuries Bradykinesia symptoms: slow movements, slowed thought processes, difficulty getting out of a chair and difficulty regaining balance Loss of smell:  Yes.   Loss of taste:  unknown Urinary Incontinence:  Yes.   Difficulty Swallowing:  Yes.  , has had aspiration in the past and was on mechanical soft foods but not back on regular diet Handwriting, micrographia: Yes.   Trouble with ADL's:  Yes.    Trouble buttoning clothing: Yes.   Depression:  Unknown but can be very combattive Memory changes:  Yes.   Hallucinations:  Yes.    visual distortions: Yes.   N/V:  No. Lightheaded:  unknown  Syncope: unknown Diplopia:  unknown Dyskinesia:  Yes.     PREVIOUS MEDICATIONS: Sinemet, Mirapex, Requip and klonopin  ALLERGIES:  No Known Allergies  CURRENT MEDICATIONS:  Outpatient Encounter Prescriptions as of 10/19/2014  Medication Sig  . acetaminophen (TYLENOL) 500 MG tablet Take 500 mg by mouth every 6 (six) hours as needed.  . ALPRAZolam (XANAX) 0.25 MG tablet Take by mouth 2 (two) times daily.   . carbidopa-levodopa (SINEMET IR) 25-250 MG tablet Take 1 tablet by mouth 5 (five) times daily.   . feeding supplement (ENSURE COMPLETE) LIQD Take 237 mLs by mouth 2 (two) times daily  between meals.  Marland Kitchen ibuprofen (ADVIL,MOTRIN) 400 MG tablet Take 400 mg by mouth every 8 (eight) hours as needed for pain. Take with food  . Melatonin 3 MG CAPS Take by mouth at bedtime as needed and may repeat dose one time if needed.  . mirtazapine (REMERON) 15 MG tablet Take 15 mg by mouth at bedtime.   . pramipexole (MIRAPEX) 0.125 MG tablet Take 0.125 mg by mouth 2 (two) times daily.   . sertraline (ZOLOFT) 50 MG tablet Take 75 mg by mouth daily. 2 by mouth daily  . [DISCONTINUED] Vitamin D, Ergocalciferol, (DRISDOL) 50000 UNITS CAPS capsule every other week  . [DISCONTINUED] Carbidopa-Levodopa ER (SINEMET CR)  25-100 MG tablet controlled release Take 1.5 tablets by mouth 6 (six) times daily. Take 1 pill 6 times a day: 6 AM, 10 AM, 2 PM, 4 PM, 7 PM and MN  . [DISCONTINUED] clonazePAM (KLONOPIN) 0.5 MG tablet Take 0.25-0.5 mg by mouth daily as needed for anxiety.  . [DISCONTINUED] divalproex (DEPAKOTE SPRINKLE) 125 MG capsule   . [DISCONTINUED] Flora-Q (FLORA-Q) CAPS Take 1 capsule by mouth daily.  . [DISCONTINUED] lisinopril (PRINIVIL,ZESTRIL) 5 MG tablet Take 1 tablet by mouth daily.  . [DISCONTINUED] Memantine HCl ER (NAMENDA XR) 7 MG CP24 Take 1 capsule (7 mg total) by mouth daily.  . [DISCONTINUED] permethrin (ELIMITE) 5 % cream   . [DISCONTINUED] promethazine (PHENERGAN) 12.5 MG tablet Take 12.5 mg by mouth every 6 (six) hours as needed for nausea.   No facility-administered encounter medications on file as of 10/19/2014.    PAST MEDICAL HISTORY:   Past Medical History  Diagnosis Date  . MVP (mitral valve prolapse) 2002    MR ,TR. SBE prophylaxis  . Dementia   . Hypercholesteremia   . Parkinson's disease (HCC)   . RBBB   . Bradycardia   . GERD (gastroesophageal reflux disease)   . DJD (degenerative joint disease)   . Fibrocystic breast   . Elevated blood pressure   . Dysphagia   . Acute respiratory distress (HCC)     PAST SURGICAL HISTORY:   Past Surgical History  Procedure Laterality Date  . Dilation and curettage of uterus    . Tonsillectomy    . Colonoscopy  2002    Negative    SOCIAL HISTORY:   Social History   Social History  . Marital Status: Married    Spouse Name: Vilma Prader  . Number of Children: 2  . Years of Education: MA   Occupational History  . Retired    Social History Main Topics  . Smoking status: Former Smoker -- 0.20 packs/day for 25 years    Types: Cigarettes    Quit date: 01/06/1995  . Smokeless tobacco: Never Used  . Alcohol Use: No  . Drug Use: No  . Sexual Activity: Not on file   Other Topics Concern  . 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 Father Deceased 56    stroke, heart disease  . Mother Deceased 51    breast cancer, stroke  . Brother Deceased     DM    ROS:  A complete 10 system review of systems was obtained and was unremarkable apart from what is mentioned above.  PHYSICAL EXAMINATION:    VITALS:   Filed Vitals:   10/19/14 1407  BP: 126/76  Pulse: 87  Weight: 113 lb (51.256 kg)    GEN:  The patient appears stated age and is in NAD. HEENT:  Normocephalic, atraumatic.  The mucous membranes are moist. The superficial temporal arteries are without ropiness or tenderness. CV:  RRR Lungs:  CTAB Neck/HEME:  There are no carotid bruits bilaterally.  Neurological examination:  Orientation: The patient is alert but doesn't/cannot answer questions of orientation. Cranial nerves: There is good facial symmetry. Pupils are equal round and reactive to light bilaterally. Fundoscopic exam is attempted but disc margins are not visualized.  She does not participate with extraocular muscle testing or visual field testing.  There is very little spontaneity of speech, but when she does speak it is sometimes dysarthric and sometimes clear, but it is overall fluent.  She does not allow me to look into the oropharynx.  Hearing does appear to be intact to conversational tone.  Sensation: She does not participate with the sensitive aspects of the sensory testing.  She does not her head that she feels vibration in all of her extremities.  She nods her head that she feels light touch. Motor: Strength is at least antigravity 4.  She does not participate with manual motor testing.  Grip strength is good and equal bilaterally.  She does  not participate with pronator drift testing. Deep tendon reflexes: Deep tendon reflexes are 1/4 at the bilateral biceps, triceps, brachioradialis, patella and achilles. Plantar responses are downgoing bilaterally.  Movement examination: Tone: Somewhat difficult to assess, as the patient has difficulty relaxing and there is significant paratonia. Abnormal movements: There is no abnormal movements at the beginning of the visit, but toward the end of the visit she became very anxious and it was time for her to have medicine.  Her husband gave her a carbidopa/levodopa 25/250 and within 20 minutes she became quite dyskinetic Coordination: The patient does attempt to participate with rapid alternating movements, but she is very apraxic.  Rapid alternating movements are slower and more decremental on the right than the left, but that is all that can be said. Gait and Station: The patient has mild difficulty arising out of a deep-seated chair without the use of the hands. The patient's stride length is normal and she walks very quickly down the hall, but she is somewhat unstable.    ASSESSMENT/PLAN:  1.  Parkinson's disease, which is complicated by motor fluctuations, hallucinations and significant Parkinson's disease dementia.  -I had a long discussion with the patient and her husband.  Given significant memory change/dementia along with hallucinations, I would recommend tapering her dopamine agonist as this can contribute to these problems.  She is on a very low dosage of Mirapex, so I do not think that this will be difficult to discontinue, nor do I think it was likely of great benefit.  -I am going to add carbidopa/levodopa 50/200 CR at night.  She really has no medication from about 3 PM (besides for 0.125 mg of Mirapex at 6 PM) until morning.  -She was dyskinetic in the office after she got her medication and I do think it would be more beneficial if we could spread out her medicine more and give her the  carbidopa/levodopa 25/100 tablets, but I do understand that there are some logistical issues given  that she lives in a facility where there are med tech's that can't necessarily be dosing meds all the time.  I did not change anything in this regard today, but in the future I may back down the carbidopa/levodopa 25/250 to carbidopa/levodopa 25/100 and use 2 of those in order to see if the slightly reduced dose would help dyskinesia.  Again, it would be more beneficial to see if we could have increased frequency of dose with smaller dosages at each dosage time, but I am not sure that is practical with her current living situation.  I will readdress this next time, as I didn't want to change too many things at once  -I also talked to the patient and her husband about duopa given on/off phenomenon.  I talked to them about the risks and benefits of this.  I think that the benefit of it would be that she would not have nearly as much off time and given that she lives in a facility, she would have the benefit of someone else being able to assist her with the start up in the morning and taking off of the pump at night.  We showed her husband the pump size today.  -talked about apokyn but her husband didn't think that the facility could accomodate.  Hopefully, levodopa nasal spray will come out within the next year and potentially apomorphine sublingual will as well.  -If hallucinations/delusional behavior are a big issue, we could entertain the addition of nuplazid in the future.  Again, I didn't want to make too many changes in one visit. 2.  Sialorrhea  -myobloc is an option if she/her husband would like.   3.  Will follow up in next 6 weeks, sooner should new neuro issues arise.  Much greater than 50% of this visit was spent in counseling with the patient and the family.  Total face to face time:  60 min, not including time for record review

## 2014-11-02 ENCOUNTER — Telehealth: Payer: Self-pay | Admitting: Neurology

## 2014-11-02 NOTE — Telephone Encounter (Signed)
We can make appt to discuss if he wants sooner than scheduled appt

## 2014-11-02 NOTE — Telephone Encounter (Signed)
Please inform patient that Dr. Arbutus Leasat is out of the office today and will return on Monday to address concerns with medication changes.  I do not see any mention of pain in her last visit visit, if this is new, this should be evaluated by her primary provider for this.

## 2014-11-02 NOTE — Telephone Encounter (Signed)
Pt /husband/Heath/called to inform that pt is having a lot of pn from 11am-4pm/asked if her meds can be adjusted?//call back @ [phone]

## 2014-11-02 NOTE — Telephone Encounter (Signed)
Husband aware that Dr. Arbutus Leasat is out of office until Monday. Will contact PCP about pain.

## 2014-11-02 NOTE — Telephone Encounter (Signed)
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 For the last several weeks, pt has had good and bad times of the day. (Bad times are around 11 am and 4 pm). Bad times of the day are increasing in frequency and duration. Pt seems to  be in pain, can't stand up, is having frequent falls.   Some changes were made to her nightly routine of medication, which do seem to have helped. Wonders if changes could be made to her day time routine to help.   Okay to leave message on VM. Pt will leave around 3 to go be with wife at nursing home.   Please advise.

## 2014-11-05 NOTE — Telephone Encounter (Signed)
Appt made for 11/13/2014.

## 2014-11-11 ENCOUNTER — Observation Stay (HOSPITAL_COMMUNITY)
Admission: EM | Admit: 2014-11-11 | Discharge: 2014-11-14 | Disposition: A | Discharge status: Home / Self Care | Payer: Medicare Other | Attending: Internal Medicine | Admitting: Internal Medicine

## 2014-11-11 ENCOUNTER — Emergency Department (HOSPITAL_COMMUNITY): Payer: Medicare Other

## 2014-11-11 ENCOUNTER — Observation Stay (HOSPITAL_COMMUNITY): Payer: Medicare Other

## 2014-11-11 ENCOUNTER — Encounter (HOSPITAL_COMMUNITY): Payer: Self-pay | Admitting: Emergency Medicine

## 2014-11-11 DIAGNOSIS — I10 Essential (primary) hypertension: Secondary | ICD-10-CM | POA: Diagnosis not present

## 2014-11-11 DIAGNOSIS — S82001A Unspecified fracture of right patella, initial encounter for closed fracture: Secondary | ICD-10-CM

## 2014-11-11 DIAGNOSIS — F418 Other specified anxiety disorders: Secondary | ICD-10-CM | POA: Diagnosis not present

## 2014-11-11 DIAGNOSIS — N183 Chronic kidney disease, stage 3 unspecified: Secondary | ICD-10-CM | POA: Diagnosis present

## 2014-11-11 DIAGNOSIS — D631 Anemia in chronic kidney disease: Secondary | ICD-10-CM

## 2014-11-11 DIAGNOSIS — D72829 Elevated white blood cell count, unspecified: Secondary | ICD-10-CM | POA: Diagnosis present

## 2014-11-11 DIAGNOSIS — Z87891 Personal history of nicotine dependence: Secondary | ICD-10-CM | POA: Diagnosis not present

## 2014-11-11 DIAGNOSIS — G20 Parkinson's disease: Secondary | ICD-10-CM

## 2014-11-11 DIAGNOSIS — Y9289 Other specified places as the place of occurrence of the external cause: Secondary | ICD-10-CM | POA: Diagnosis not present

## 2014-11-11 DIAGNOSIS — S82021A Displaced longitudinal fracture of right patella, initial encounter for closed fracture: Principal | ICD-10-CM | POA: Insufficient documentation

## 2014-11-11 DIAGNOSIS — Z66 Do not resuscitate: Secondary | ICD-10-CM

## 2014-11-11 DIAGNOSIS — R627 Adult failure to thrive: Secondary | ICD-10-CM | POA: Insufficient documentation

## 2014-11-11 DIAGNOSIS — Y998 Other external cause status: Secondary | ICD-10-CM | POA: Diagnosis not present

## 2014-11-11 DIAGNOSIS — Z79899 Other long term (current) drug therapy: Secondary | ICD-10-CM | POA: Insufficient documentation

## 2014-11-11 DIAGNOSIS — G20A1 Parkinson's disease without dyskinesia, without mention of fluctuations: Secondary | ICD-10-CM | POA: Diagnosis present

## 2014-11-11 DIAGNOSIS — N189 Chronic kidney disease, unspecified: Secondary | ICD-10-CM

## 2014-11-11 DIAGNOSIS — E875 Hyperkalemia: Secondary | ICD-10-CM

## 2014-11-11 DIAGNOSIS — Z515 Encounter for palliative care: Secondary | ICD-10-CM

## 2014-11-11 DIAGNOSIS — F419 Anxiety disorder, unspecified: Secondary | ICD-10-CM | POA: Diagnosis present

## 2014-11-11 DIAGNOSIS — S8991XA Unspecified injury of right lower leg, initial encounter: Secondary | ICD-10-CM | POA: Diagnosis present

## 2014-11-11 DIAGNOSIS — W19XXXA Unspecified fall, initial encounter: Secondary | ICD-10-CM | POA: Diagnosis not present

## 2014-11-11 DIAGNOSIS — W1839XA Other fall on same level, initial encounter: Secondary | ICD-10-CM | POA: Diagnosis not present

## 2014-11-11 DIAGNOSIS — T148XXA Other injury of unspecified body region, initial encounter: Secondary | ICD-10-CM

## 2014-11-11 DIAGNOSIS — F329 Major depressive disorder, single episode, unspecified: Secondary | ICD-10-CM | POA: Diagnosis present

## 2014-11-11 DIAGNOSIS — Y9389 Activity, other specified: Secondary | ICD-10-CM | POA: Diagnosis not present

## 2014-11-11 DIAGNOSIS — R636 Underweight: Secondary | ICD-10-CM

## 2014-11-11 DIAGNOSIS — R451 Restlessness and agitation: Secondary | ICD-10-CM

## 2014-11-11 DIAGNOSIS — F0390 Unspecified dementia without behavioral disturbance: Secondary | ICD-10-CM | POA: Diagnosis not present

## 2014-11-11 DIAGNOSIS — M25461 Effusion, right knee: Secondary | ICD-10-CM

## 2014-11-11 DIAGNOSIS — E43 Unspecified severe protein-calorie malnutrition: Secondary | ICD-10-CM | POA: Diagnosis present

## 2014-11-11 LAB — COMPREHENSIVE METABOLIC PANEL
ALT: 6 U/L — ABNORMAL LOW (ref 14–54)
ANION GAP: 9 (ref 5–15)
AST: 23 U/L (ref 15–41)
Albumin: 4.1 g/dL (ref 3.5–5.0)
Alkaline Phosphatase: 120 U/L (ref 38–126)
BUN: 33 mg/dL — ABNORMAL HIGH (ref 6–20)
CHLORIDE: 107 mmol/L (ref 101–111)
CO2: 24 mmol/L (ref 22–32)
Calcium: 9.3 mg/dL (ref 8.9–10.3)
Creatinine, Ser: 1.33 mg/dL — ABNORMAL HIGH (ref 0.44–1.00)
GFR calc non Af Amer: 38 mL/min — ABNORMAL LOW (ref >60)
GFR, EST AFRICAN AMERICAN: 44 mL/min — AB (ref >60)
Glucose, Bld: 113 mg/dL — ABNORMAL HIGH (ref 65–99)
Potassium: 5.2 mmol/L — ABNORMAL HIGH (ref 3.5–5.1)
SODIUM: 140 mmol/L (ref 135–145)
Total Bilirubin: 1.2 mg/dL (ref 0.3–1.2)
Total Protein: 6.7 g/dL (ref 6.5–8.1)

## 2014-11-11 LAB — CBC WITH DIFFERENTIAL/PLATELET
Basophils Absolute: 0 10*3/uL (ref 0.0–0.1)
Basophils Relative: 0 %
Eosinophils Absolute: 0.3 10*3/uL (ref 0.0–0.7)
Eosinophils Relative: 3 %
HCT: 33.2 % — ABNORMAL LOW (ref 36.0–46.0)
Hemoglobin: 10.8 g/dL — ABNORMAL LOW (ref 12.0–15.0)
Lymphocytes Relative: 22 %
Lymphs Abs: 2.5 10*3/uL (ref 0.7–4.0)
MCH: 31.5 pg (ref 26.0–34.0)
MCHC: 32.5 g/dL (ref 30.0–36.0)
MCV: 96.8 fL (ref 78.0–100.0)
Monocytes Absolute: 1.1 10*3/uL — ABNORMAL HIGH (ref 0.1–1.0)
Monocytes Relative: 10 %
Neutro Abs: 7.1 10*3/uL (ref 1.7–7.7)
Neutrophils Relative %: 65 %
Platelets: 283 10*3/uL (ref 150–400)
RBC: 3.43 MIL/uL — ABNORMAL LOW (ref 3.87–5.11)
RDW: 14 % (ref 11.5–15.5)
WBC: 11 10*3/uL — ABNORMAL HIGH (ref 4.0–10.5)

## 2014-11-11 LAB — BASIC METABOLIC PANEL
ANION GAP: 5 (ref 5–15)
BUN: 28 mg/dL — ABNORMAL HIGH (ref 6–20)
CALCIUM: 8.8 mg/dL — AB (ref 8.9–10.3)
CO2: 24 mmol/L (ref 22–32)
Chloride: 109 mmol/L (ref 101–111)
Creatinine, Ser: 1.2 mg/dL — ABNORMAL HIGH (ref 0.44–1.00)
GFR, EST AFRICAN AMERICAN: 50 mL/min — AB (ref >60)
GFR, EST NON AFRICAN AMERICAN: 43 mL/min — AB (ref >60)
GLUCOSE: 109 mg/dL — AB (ref 65–99)
Potassium: 4 mmol/L (ref 3.5–5.1)
Sodium: 138 mmol/L (ref 135–145)

## 2014-11-11 LAB — SAMPLE TO BLOOD BANK

## 2014-11-11 MED ORDER — MIRTAZAPINE 15 MG PO TABS: 15.0000 mg | ORAL_TABLET | Freq: Every day | ORAL | Status: DC

## 2014-11-11 MED ORDER — HYDRALAZINE HCL 20 MG/ML IJ SOLN: 5.0000 mg | Freq: Four times a day (QID) | INTRAMUSCULAR | Status: DC | PRN

## 2014-11-11 MED ORDER — OXYCODONE HCL 5 MG PO TABS: 5.0000 mg | ORAL_TABLET | ORAL | Status: DC | PRN

## 2014-11-11 MED ORDER — ONDANSETRON HCL 4 MG PO TABS: 4.0000 mg | ORAL_TABLET | Freq: Four times a day (QID) | ORAL | Status: DC | PRN

## 2014-11-11 MED ORDER — SERTRALINE HCL 50 MG PO TABS
75.0000 mg | ORAL_TABLET | Freq: Every day | ORAL | Status: DC
  Administered 2014-11-13 – 2014-11-14 (×2): 75 mg via ORAL
  Filled 2014-11-11 (×2): qty 2

## 2014-11-11 MED ORDER — FENTANYL CITRATE (PF) 100 MCG/2ML IJ SOLN
50.0000 ug | INTRAMUSCULAR | Status: DC | PRN
  Administered 2014-11-11 – 2014-11-13 (×3): 50 ug via INTRAVENOUS
  Filled 2014-11-11 (×3): qty 2

## 2014-11-11 MED ORDER — CARBIDOPA-LEVODOPA ER 50-200 MG PO TBCR
1.0000 | EXTENDED_RELEASE_TABLET | Freq: Every day | ORAL | Status: DC
  Filled 2014-11-11: qty 1

## 2014-11-11 MED ORDER — ALPRAZOLAM 0.25 MG PO TABS
0.2500 mg | ORAL_TABLET | Freq: Two times a day (BID) | ORAL | Status: DC
  Administered 2014-11-11 – 2014-11-13 (×4): 0.25 mg via ORAL
  Filled 2014-11-11 (×3): qty 1

## 2014-11-11 MED ORDER — CARBIDOPA-LEVODOPA ER 50-200 MG PO TBCR
1.0000 | EXTENDED_RELEASE_TABLET | Freq: Every day | ORAL | Status: DC
  Administered 2014-11-11 – 2014-11-13 (×3): 1 via ORAL
  Filled 2014-11-11 (×5): qty 1

## 2014-11-11 MED ORDER — CARBIDOPA-LEVODOPA 25-250 MG PO TABS
1.0000 | ORAL_TABLET | Freq: Every day | ORAL | Status: DC
  Filled 2014-11-11 (×4): qty 1

## 2014-11-11 MED ORDER — CELECOXIB 100 MG PO CAPS: 100.0000 mg | ORAL_CAPSULE | Freq: Two times a day (BID) | ORAL | Status: DC

## 2014-11-11 MED ORDER — SERTRALINE HCL 50 MG PO TABS
75.0000 mg | ORAL_TABLET | Freq: Every day | ORAL | Status: DC
  Filled 2014-11-11: qty 2

## 2014-11-11 MED ORDER — ALPRAZOLAM 0.25 MG PO TABS
0.2500 mg | ORAL_TABLET | Freq: Two times a day (BID) | ORAL | Status: DC
  Filled 2014-11-11: qty 1

## 2014-11-11 MED ORDER — LORAZEPAM 2 MG/ML IJ SOLN
1.0000 mg | Freq: Once | INTRAMUSCULAR | Status: AC
  Administered 2014-11-11: 1 mg via INTRAVENOUS
  Filled 2014-11-11: qty 1

## 2014-11-11 MED ORDER — CARBIDOPA-LEVODOPA 25-250 MG PO TABS
1.0000 | ORAL_TABLET | Freq: Every day | ORAL | Status: DC
  Administered 2014-11-11 – 2014-11-13 (×7): 1 via ORAL
  Filled 2014-11-11 (×19): qty 1

## 2014-11-11 MED ORDER — FENTANYL CITRATE (PF) 100 MCG/2ML IJ SOLN
50.0000 ug | INTRAMUSCULAR | Status: DC | PRN
  Administered 2014-11-11: 50 ug via INTRAVENOUS
  Filled 2014-11-11: qty 2

## 2014-11-11 MED ORDER — SODIUM CHLORIDE 0.9 % IV SOLN
INTRAVENOUS | Status: DC
  Administered 2014-11-11: 16:00:00 via INTRAVENOUS
  Administered 2014-11-13: 1000 mL via INTRAVENOUS
  Administered 2014-11-13: 15:00:00 via INTRAVENOUS

## 2014-11-11 MED ORDER — FENTANYL CITRATE (PF) 100 MCG/2ML IJ SOLN
50.0000 ug | Freq: Once | INTRAMUSCULAR | Status: AC
  Administered 2014-11-11: 50 ug via INTRAVENOUS
  Filled 2014-11-11: qty 2

## 2014-11-11 MED ORDER — ACETAMINOPHEN 500 MG PO TABS
500.0000 mg | ORAL_TABLET | Freq: Four times a day (QID) | ORAL | Status: DC
  Administered 2014-11-11 – 2014-11-13 (×6): 500 mg via ORAL
  Filled 2014-11-11 (×6): qty 1

## 2014-11-11 MED ORDER — RISAQUAD PO CAPS
1.0000 | ORAL_CAPSULE | Freq: Every day | ORAL | Status: DC
Start: 2014-11-11 — End: 2014-11-13
  Administered 2014-11-11 – 2014-11-13 (×2): 1 via ORAL
  Filled 2014-11-11 (×3): qty 1

## 2014-11-11 MED ORDER — ENSURE ENLIVE PO LIQD
237.0000 mL | Freq: Three times a day (TID) | ORAL | Status: DC
  Administered 2014-11-11 – 2014-11-13 (×4): 237 mL via ORAL
  Filled 2014-11-11 (×2): qty 237

## 2014-11-11 MED ORDER — CELECOXIB 100 MG PO CAPS
100.0000 mg | ORAL_CAPSULE | Freq: Two times a day (BID) | ORAL | Status: DC
  Filled 2014-11-11 (×2): qty 1

## 2014-11-11 MED ORDER — CARBIDOPA-LEVODOPA ER 50-200 MG PO TBCR
1.0000 | EXTENDED_RELEASE_TABLET | Freq: Two times a day (BID) | ORAL | Status: DC
  Filled 2014-11-11: qty 1

## 2014-11-11 MED ORDER — LORAZEPAM 2 MG/ML IJ SOLN
0.5000 mg | INTRAMUSCULAR | Status: DC | PRN
  Administered 2014-11-11 – 2014-11-13 (×4): 0.5 mg via INTRAVENOUS
  Filled 2014-11-11 (×4): qty 1

## 2014-11-11 MED ORDER — CARBIDOPA-LEVODOPA 25-250 MG PO TABS
1.0000 | ORAL_TABLET | Freq: Every day | ORAL | Status: DC
  Filled 2014-11-11: qty 1

## 2014-11-11 MED ORDER — ONDANSETRON HCL 4 MG/2ML IJ SOLN: 4.0000 mg | Freq: Four times a day (QID) | INTRAMUSCULAR | Status: DC | PRN

## 2014-11-11 NOTE — ED Provider Notes (Signed)
CSN: [id number]     Arrival date & time 11/11/14  1006 History   First MD Initiated Contact with Patient 11/11/14 1008     Chief Complaint  Patient presents with  . Fall  . Joint Swelling     (Consider location/radiation/quality/duration/timing/severity/associated sxs/prior Treatment) Patient is a 74 y.o. female presenting with fall. The history is provided by the patient.  Fall   level V caveat due to dementia. Patient is reportedly had swelling of her right knee for the last few days. Reportedly had another fall today and with more pain and swelling in her right knee. Also may have right hip pain. Reported no loss of consciousness. Patient cannot participate In her history much for me.  History reviewed. No pertinent past medical history. Past Surgical History  Procedure Laterality Date  . Dilation and curettage of uterus    . Tonsillectomy    . Colonoscopy  2002    Negative   Family History  Problem Relation Age of Onset  . Stroke Father   . Hypertension Father   . Heart failure Father   . Asthma Father   . Diabetes Cousin   . Stroke Mother   . Hypertension Mother   . Breast cancer Mother 35   Social History  Substance Use Topics  . Smoking status: Former Smoker -- 0.20 packs/day for 25 years    Types: Cigarettes    Quit date: 01/06/1995  . Smokeless tobacco: Never Used  . Alcohol Use: No   OB History    No data available     Review of Systems  Unable to perform ROS: Dementia      Allergies  Review of patient's allergies indicates no known allergies.  Home Medications   Prior to Admission medications   Medication Sig Start Date End Date Taking? Authorizing Provider  acetaminophen (TYLENOL) 500 MG tablet Take 500 mg by mouth 4 (four) times daily. 0600, 1000, 1400, 1800   Yes Historical Provider, MD  ALPRAZolam (XANAX) 0.25 MG tablet Take by mouth 2 (two) times daily.  10/04/14  Yes Historical Provider, MD  carbidopa-levodopa (SINEMET CR) 50-200 MG  tablet Take 1 tablet by mouth at bedtime. 10/19/14  Yes Rebecca S Tat, DO  carbidopa-levodopa (SINEMET IR) 25-250 MG tablet Take 1 tablet by mouth 5 (five) times daily. 0600, 0900, 1200, 1500, 1800 10/07/14  Yes Historical Provider, MD  celecoxib (CELEBREX) 100 MG capsule Take 100 mg by mouth 2 (two) times daily.   Yes Historical Provider, MD  feeding supplement (ENSURE COMPLETE) LIQD Take 237 mLs by mouth 2 (two) times daily between meals. Patient taking differently: Take 237 mLs by mouth 4 (four) times daily -  before meals and at bedtime.  05/27/12  Yes Pasty Spillers McLean-Scocozza, MD  Melatonin 3 MG CAPS Take by mouth at bedtime as needed and may repeat dose one time if needed.   Yes Historical Provider, MD  mirtazapine (REMERON) 15 MG tablet Take 15 mg by mouth at bedtime.  10/07/14  Yes Historical Provider, MD  Probiotic Product (ULTRAFLORA IMMUNE HEALTH PO) Take 1 tablet by mouth daily.   Yes Historical Provider, MD  sertraline (ZOLOFT) 50 MG tablet Take 75 mg by mouth daily.  02/02/11  Yes Pecola Lawless, MD   BP 165/92 mmHg  Pulse 66  Temp(Src) 97.5 F (36.4 C) (Axillary)  Resp 18  Ht  (1.651 m)  Wt 107 lb 12.8 oz (48.898 kg)  BMI 17.94 kg/m2  SpO2 100% Physical Exam  Constitutional: She appears well-developed.  HENT:  Head: Atraumatic.  Eyes: Right eye exhibits no discharge. Left eye exhibits no discharge.  Neck: Neck supple.  Cardiovascular: Normal rate.   Pulmonary/Chest: Effort normal.  Abdominal: There is no tenderness.  Musculoskeletal:  Large swelling to right knee. Some crepitance. Decreased range of motion. Able to move toes bilaterally. Good pulse in both feet. Possible tenderness over right hip but difficult examination due to patient's cooperation.  Neurological: She is alert.  Patient will not follow commands. Awake and grabbing at her knee.  Skin: Skin is warm.    ED Course  Procedures (including critical care time) Labs Review Labs Reviewed  COMPREHENSIVE  METABOLIC PANEL - Abnormal; Notable for the following:    Potassium 5.2 (*)    Glucose, Bld 113 (*)    BUN 33 (*)    Creatinine, Ser 1.33 (*)    ALT 6 (*)    GFR calc non Af Amer 38 (*)    GFR calc Af Amer 44 (*)    All other components within normal limits  CBC WITH DIFFERENTIAL/PLATELET - Abnormal; Notable for the following:    WBC 11.0 (*)    RBC 3.43 (*)    Hemoglobin 10.8 (*)    HCT 33.2 (*)    Monocytes Absolute 1.1 (*)    All other components within normal limits  BASIC METABOLIC PANEL - Abnormal; Notable for the following:    BUN 28 (*)    Calcium 8.7 (*)    GFR calc non Af Amer 55 (*)    All other components within normal limits  CBC - Abnormal; Notable for the following:    RBC 3.21 (*)    Hemoglobin 10.1 (*)    HCT 32.2 (*)    MCV 100.3 (*)    All other components within normal limits  BASIC METABOLIC PANEL - Abnormal; Notable for the following:    Glucose, Bld 109 (*)    BUN 28 (*)    Creatinine, Ser 1.20 (*)    Calcium 8.8 (*)    GFR calc non Af Amer 43 (*)    GFR calc Af Amer 50 (*)    All other components within normal limits  GLUCOSE, CAPILLARY  SAMPLE TO BLOOD BANK    Imaging Review Dg Chest 1 View  11/11/2014  CLINICAL DATA:  Larey SeatFell today.  Right-sided pain. EXAM: CHEST 1 VIEW COMPARISON:  05/27/2012 FINDINGS: The heart is borderline enlarged but stable. There is moderate tortuosity and ectasia of the thoracic aorta which is stable. The lungs are clear. No pleural effusion. The bony thorax is intact. IMPRESSION: No acute cardiopulmonary findings and no definite acute fractures. Electronically Signed   By: Rudie MeyerP.  Gallerani M.D.   On: 11/11/2014 11:47   Ct Knee Right Wo Contrast  11/11/2014  CLINICAL DATA:  Larey SeatFell today.  Knee pain and swelling. EXAM: CT OF THE right KNEE WITHOUT CONTRAST TECHNIQUE: Multidetector CT imaging of the right knee was performed according to the standard protocol. Multiplanar CT image reconstructions were also generated. COMPARISON:   Radiographs 11/11/2014 FINDINGS: The patella is split into 2 parts longitudinally. However, this does not have the appearance of an acute fracture. It is much more likely some type of prior surgical procedure with a sharp, corticated smooth margin. There also appears to be a defect in the quadriceps tendon. The patella tendon appears to be intact to both halves of the patella. Advanced tricompartmental degenerative changes with joint space narrowing, osteophytic spurring and subchondral  cystic change. Very large joint effusion with small loose bodies. No fracture of the tibia, femur or fibula is identified. Chondrocalcinosis is noted. IMPRESSION: 1. Longitudinally split patella appears to be a remote postsurgical finding and not an acute fracture. 2. Large suprapatellar knee joint effusion and small loose bodies. 3. Tricompartmental degenerative changes.  No acute fracture. Electronically Signed   By: Rudie Meyer M.D.   On: 11/11/2014 14:23   Dg Knee Complete 4 Views Right  11/11/2014  CLINICAL DATA:  Right knee swelling for the past 2 days. Worse after falling this morning. EXAM: RIGHT KNEE - COMPLETE 4+ VIEW COMPARISON:  None. FINDINGS: Diffuse anterior soft tissue swelling. Lodged signal fracture through the patella with distraction of the fragments. This is mildly comminuted and some of the margins are poorly defined. There is an associated moderate-sized effusion. Also noted is mild tricompartmental spur formation. IMPRESSION: 1. Longitudinal fracture through the patella with distraction of the fragments. 2. Moderate-sized effusion. 3. Mild degenerative changes. Electronically Signed   By: Beckie Salts M.D.   On: 11/11/2014 11:41   Dg Hip Unilat With Pelvis 2-3 Views Right  11/11/2014  CLINICAL DATA:  74 year old female with a history a trauma from a fall landing onto her right side complaining of right-sided hip pain. EXAM: DG HIP (WITH OR WITHOUT PELVIS) 2-3V RIGHT COMPARISON:  No priors. FINDINGS:  Study is limited by atypical projections (patient is consistently rotated to the right). Despite this limitation, there is apparent cortical disruption in the parasymphyseal region of the right pubic bone. Other visualized portions of the pelvis appear intact. Right proximal femur appears intact, although images demonstrate essentially the same projection, which limits assessment. Right femoral head is properly located. IMPRESSION: 1. Limited study demonstrating what appears to be a parasymphyseal fracture of the right pubic bone. Electronically Signed   By: Trudie Reed M.D.   On: 11/11/2014 11:48   I have personally reviewed and evaluated these images and lab results as part of my medical decision-making.   EKG Interpretation   Date/Time:  Sunday November 11 2014 10:58:25 EST Ventricular Rate:  97 PR Interval:  130 QRS Duration: 115 QT Interval:  350 QTC Calculation: 445 R Axis:   -5 Text Interpretation:  Sinus rhythm Incomplete RBBB and LAFB ED PHYSICIAN  INTERPRETATION AVAILABLE IN [facility] Confirmed by TEST, Record  ([id number]) on 11/12/2014 6:47:00 AM      MDM   Final diagnoses:  Fall, initial encounter  Patella fracture, right, closed, initial encounter    Patient with fall. X-ray showed possible patellar fracture. Pain control needed. Patient is at memory care may need more health. Tenderness over knee. Admit to internal medicine after discussion with orthopedic.    Benjiman Core, MD 11/12/14 [phone]

## 2014-11-11 NOTE — ED Notes (Signed)
Made Ortho tech aware of knee immobilizer ordered for pt.

## 2014-11-11 NOTE — Progress Notes (Addendum)
Pts husband left at 1915, and pt has been agitated and restless since then. She is screaming out, thrashing around in the bed, shaking her head side to side, and unable to be reoriented. She holds her right leg straight up in the air and grimaces as if it is hurting. I explained to her that with having a pelvic fracture, it is only going to make it hurt worse to hold it up like that, but she doesn't demonstrate understanding. She has received fentanyl at 1906 and 2106, and scheduled tylenol in between without much effect on her behavior so it seems to be more like agitation. Her husband returned and says her behavior is pretty close to this at baseline due to parkinsons aside from what she is doing with the right leg. PA paged and received order for ativan IV prn and sitter at bedside. IV ativan is helping pt to settle down and husbands presence seems to help as well. Will continue to monitor. Mick SellShannon June Rode RN

## 2014-11-11 NOTE — ED Notes (Signed)
Bed: [id number] Expected date: 11/11/14 Expected time: 10:06 AM Means of arrival: Ambulance Comments: Fall, hip injury

## 2014-11-11 NOTE — ED Notes (Addendum)
Per EMS [facility]  Pt landed on her right side.  Pt has had swelling in right knee x 2 days and being treated for it but swelling has gotten worse since the fall this am.  Pt has dementia and Parkison's.  Pt restless on stretcher and screaming out.

## 2014-11-11 NOTE — Progress Notes (Signed)
Pt received ativan 0.5mg  IV at 2150. She rested for around 15 minutes then became agitated and restless again just as before. Pts husband stated he had never seen her act this way before. Question if this behavior is caused by the IV fentanyl. Paged PA to discuss situation and agreed to not give anymore fentanyl. Also received order for additional 1mg  IV ativan. PA considered haldol but noted that pt's QTc interval was already prolonged on EKG earlier today. If pt settles down and is still enough to perform EKG we will do so. Continue to monitor. Mick SellShannon Wesleigh Markovic RN

## 2014-11-11 NOTE — H&P (Addendum)
[facility] History and Physical  Melissa Pollard AVW:[id number] DOB: 02-03-1940 DOA: 11/11/2014  Referring physician: ER physician: Dr. Benjiman Core  PCP: Herschel Senegal, MD  Chief Complaint: fall   HPI:  74 y.o. female with past medical history of hypertension, parkinson's disease and related dementia, anxiety and depression who presented to [facility] ED status post fall which was few days ago and also today. Pt has swelling in right knee and pain but she is not a good historian due to her dementia. She reports her pain is 10/10 in intensity, constant and in the right knee area. Pain not relieved with tylenol but fentanyl given in ED did help with pain relief. No fevers. No complaints of chest pain, shortness of breath or palpitations. No blood in stool or urine . No abdominal pain, nausea or vomiting.   In ED, right knee immobilizer placed. Pt was hemodynamically stable. Blood work showed WBC count 11, hemoglobin 10.8, potassium 5.2, Cr 1.33. X ray of the right knee showed no acute fracture which was confirmed with CT scan. It did however show large suprapatellar knee joint effusion. Hip x ray also showed right pubic ramus fracture. CXR showed no acute findings. She was admitted for further evaluation and management of her knee pain and knee effusion.  Assessment & Plan    Principal Problem:   Effusion of right knee / Fall - Order placed for IR to attempt arthrocentesis - No evidence of acute infectious process - Continue pain management efforts  Active Problems:   Benign essential HTN - Add hydralazine PRN for better BP control    Hyperkalemia - Unclear etiology - Repeat potassium WNL    Leukocytosis - Likely reactive - No obvious source of infection - No acute infectious process seen on x ray or CT scan    CKD (chronic kidney disease) stage 3, GFR 30-59 ml/min - Creatinine 1.3 on admission - Improving with IV fluids    Anemia of chronic kidney failure - Hemoglobin  stable - No current indication for transfusion    Anxiety and depression - Continue xanax and Zoloft     Parkinson disease (HCC) - Continue sinemet - PT eval in am    Severe protein-calorie malnutrition (HCC) / Underweight - Body mass index is 17.94 kg/(m^2).  - Nutrition consulted    DVT prophylaxis:  - SCD's bilaterally   Radiological Exams on Admission: Dg Chest 1 View 11/11/2014  No acute cardiopulmonary findings and no definite acute fractures.  Ct Knee Right Wo Contrast 11/11/2014  1. Longitudinally split patella appears to be a remote postsurgical finding and not an acute fracture. 2. Large suprapatellar knee joint effusion and small loose bodies. 3. Tricompartmental degenerative changes.  No acute fracture.   Dg Knee Complete 4 Views Right 11/11/2014  Longitudinal fracture through the patella with distraction of the fragments. 2. Moderate-sized effusion. 3. Mild degenerative changes.  Dg Hip Unilat With Pelvis 2-3 Views Right 11/11/2014 1. Limited study demonstrating what appears to be a parasymphyseal fracture of the right pubic bone.    Code Status: Full Family Communication: Plan of care discussed with the patient and her daughter at the bedside  Disposition Plan: Admit for further evaluation, medical floor   Manson Passey, MD  [facility] Pager [phone]  Time spent in minutes: 55 minutes  Review of Systems:  Constitutional: Negative for fever, chills and malaise/fatigue. Negative for diaphoresis.  HENT: Negative for hearing loss, ear pain, nosebleeds, congestion, sore throat, neck pain, tinnitus and ear discharge.  Eyes: Negative for blurred vision, double vision, photophobia, pain, discharge and redness.  Respiratory: Negative for cough, hemoptysis, sputum production, shortness of breath, wheezing and stridor.   Cardiovascular: Negative for chest pain, palpitations, orthopnea, claudication and leg swelling.  Gastrointestinal: Negative for nausea, vomiting  and abdominal pain. Negative for heartburn, constipation, blood in stool and melena.  Genitourinary: Negative for dysuria, urgency, frequency, hematuria and flank pain.  Musculoskeletal: positive for knee pain and knee swelling. No pain in left knee.  Skin: Negative for itching and rash.  Neurological: Negative for dizziness and weakness. Negative for tingling, tremors, sensory change, speech change, focal weakness, loss of consciousness and headaches.  Endo/Heme/Allergies: Negative for environmental allergies and polydipsia. Does not bruise/bleed easily.  Psychiatric/Behavioral: Negative for suicidal ideas. The patient is not nervous/anxious.      Past Medical History  Diagnosis Date  . MVP (mitral valve prolapse) 2002    MR ,TR. SBE prophylaxis  . Dementia   . Hypercholesteremia   . Parkinson's disease (HCC)   . RBBB   . Bradycardia   . GERD (gastroesophageal reflux disease)   . DJD (degenerative joint disease)   . Fibrocystic breast   . Elevated blood pressure   . Dysphagia   . Acute respiratory distress (HCC)   . Anxiety   . Depressed    Past Surgical History  Procedure Laterality Date  . Dilation and curettage of uterus    . Tonsillectomy    . Colonoscopy  2002    Negative   Social History:  reports that she quit smoking about 19 years ago. Her smoking use included Cigarettes. She has a 5 pack-year smoking history. She has never used smokeless tobacco. She reports that she does not drink alcohol or use illicit drugs.  No Known Allergies  Family History:  Family History  Problem Relation Age of Onset  . Stroke Father   . Hypertension Father   . Heart failure Father   . Asthma Father   . Diabetes Cousin   . Stroke Mother   . Hypertension Mother   . Breast cancer Mother [id number]     Prior to Admission medications   Medication Sig Start Date End Date Taking? Authorizing Provider  acetaminophen (TYLENOL) 500 MG tablet Take 500 mg by mouth 4 (four) times daily. 0600,  1000, 1400, 1800   Yes Historical Provider, MD  ALPRAZolam (XANAX) 0.25 MG tablet Take by mouth 2 (two) times daily.  10/04/14  Yes Historical Provider, MD  carbidopa-levodopa (SINEMET CR) 50-200 MG tablet Take 1 tablet by mouth at bedtime. 10/19/14  Yes Rebecca S Tat, DO  carbidopa-levodopa (SINEMET IR) 25-250 MG tablet Take 1 tablet by mouth 5 (five) times daily. 0600, 0900, 1200, 1500, 1800 10/07/14  Yes Historical Provider, MD  celecoxib (CELEBREX) 100 MG capsule Take 100 mg by mouth 2 (two) times daily.   Yes Historical Provider, MD  feeding supplement (ENSURE COMPLETE) LIQD Take 237 mLs by mouth 2 (two) times daily between meals. Patient taking differently: Take 237 mLs by mouth 4 (four) times daily -  before meals and at bedtime.  05/27/12  Yes Pasty Spillersracy N McLean-Scocozza, MD  Melatonin 3 MG CAPS Take by mouth at bedtime as needed and may repeat dose one time if needed.   Yes Historical Provider, MD  mirtazapine (REMERON) 15 MG tablet Take 15 mg by mouth at bedtime.  10/07/14  Yes Historical Provider, MD  Probiotic Product (ULTRAFLORA IMMUNE HEALTH PO) Take 1 tablet by mouth daily.   Yes  Historical Provider, MD  sertraline (ZOLOFT) 50 MG tablet Take 75 mg by mouth daily.  02/02/11  Yes Pecola Lawless, MD   Physical Exam: Filed Vitals:   11/11/14 1300 11/11/14 1330 11/11/14 1428 11/11/14 2005  BP: 145/86 149/84 183/101   Pulse:   75 80  Temp:   97.9 F (36.6 C) 97.6 F (36.4 C)  TempSrc:   Axillary Oral  Resp: Height:    (1.651 m)   Weight:   48.898 kg (107 lb 12.8 oz)   SpO2:   100% 96%    Physical Exam  Constitutional: Appears malnourished. No distress.  HENT: Normocephalic. No tonsillar erythema or exudates Eyes: Conjunctivae are normal. No scleral icterus.  Neck: Normal ROM. Neck supple. No JVD. No tracheal deviation. No thyromegaly.  CVS: RRR, S1/S2 appreciated  Pulmonary: Effort and breath sounds normal, no stridor, rhonchi, wheezes, rales.  Abdominal: Soft.  BS +,  no distension, tenderness, rebound or guarding.  Musculoskeletal: tenderness in right knee which is swollen, no edema in left knee, no pedal edema Lymphadenopathy: No lymphadenopathy noted, cervical, inguinal. Neuro: Alert. No focal neurologic deficits. Skin: Skin is warm and dry.  Psychiatric: Normal mood and affect. Behavior, judgment, thought content normal.   Labs on Admission:  Basic Metabolic Panel:  Recent Labs Lab 11/11/14 1056 11/11/14 1808  NA 140 138  K 5.2* 4.0  CL 107 109  CO2 24 24  GLUCOSE 113* 109*  BUN 33* 28*  CREATININE 1.33* 1.20*  CALCIUM 9.3 8.8*   Liver Function Tests:  Recent Labs Lab 11/11/14 1056  AST 23  ALT 6*  ALKPHOS 120  BILITOT 1.2  PROT 6.7  ALBUMIN 4.1   No results for input(s): LIPASE, AMYLASE in the last 168 hours. No results for input(s): AMMONIA in the last 168 hours. CBC:  Recent Labs Lab 11/11/14 1056  WBC 11.0*  NEUTROABS 7.1  HGB 10.8*  HCT 33.2*  MCV 96.8  PLT 283   Cardiac Enzymes: No results for input(s): CKTOTAL, CKMB, CKMBINDEX, TROPONINI in the last 168 hours. BNP: Invalid input(s): POCBNP CBG: No results for input(s): GLUCAP in the last 168 hours.  If 7PM-7AM, please contact night-coverage [email or web address] Password [facility] 11/11/2014, 8:35 PM

## 2014-11-11 NOTE — ED Notes (Signed)
Patient transported to CT 

## 2014-11-12 DIAGNOSIS — N183 Chronic kidney disease, stage 3 (moderate): Secondary | ICD-10-CM | POA: Diagnosis not present

## 2014-11-12 DIAGNOSIS — M25461 Effusion, right knee: Secondary | ICD-10-CM | POA: Diagnosis not present

## 2014-11-12 DIAGNOSIS — F418 Other specified anxiety disorders: Secondary | ICD-10-CM | POA: Diagnosis not present

## 2014-11-12 DIAGNOSIS — W19XXXS Unspecified fall, sequela: Secondary | ICD-10-CM

## 2014-11-12 DIAGNOSIS — I10 Essential (primary) hypertension: Secondary | ICD-10-CM | POA: Diagnosis not present

## 2014-11-12 LAB — BASIC METABOLIC PANEL
ANION GAP: 5 (ref 5–15)
BUN: 28 mg/dL — ABNORMAL HIGH (ref 6–20)
CALCIUM: 8.7 mg/dL — AB (ref 8.9–10.3)
CHLORIDE: 111 mmol/L (ref 101–111)
CO2: 25 mmol/L (ref 22–32)
Creatinine, Ser: 0.99 mg/dL (ref 0.44–1.00)
GFR calc Af Amer: >60 mL/min (ref >60)
GFR calc non Af Amer: 55 mL/min — ABNORMAL LOW (ref >60)
GLUCOSE: 99 mg/dL (ref 65–99)
Potassium: 4.4 mmol/L (ref 3.5–5.1)
Sodium: 141 mmol/L (ref 135–145)

## 2014-11-12 LAB — CBC
HCT: 32.2 % — ABNORMAL LOW (ref 36.0–46.0)
HEMOGLOBIN: 10.1 g/dL — AB (ref 12.0–15.0)
MCH: 31.5 pg (ref 26.0–34.0)
MCHC: 31.4 g/dL (ref 30.0–36.0)
MCV: 100.3 fL — AB (ref 78.0–100.0)
Platelets: 227 10*3/uL (ref 150–400)
RBC: 3.21 MIL/uL — ABNORMAL LOW (ref 3.87–5.11)
RDW: 14.2 % (ref 11.5–15.5)
WBC: 8.8 10*3/uL (ref 4.0–10.5)

## 2014-11-12 LAB — GLUCOSE, CAPILLARY: GLUCOSE-CAPILLARY: 80 mg/dL (ref 65–99)

## 2014-11-12 MED ORDER — METHOCARBAMOL 1000 MG/10ML IJ SOLN
500.0000 mg | Freq: Three times a day (TID) | INTRAVENOUS | Status: DC | PRN
  Administered 2014-11-12 – 2014-11-13 (×3): 500 mg via INTRAVENOUS
  Filled 2014-11-12 (×6): qty 5

## 2014-11-12 MED ORDER — KETOROLAC TROMETHAMINE 30 MG/ML IJ SOLN
30.0000 mg | Freq: Once | INTRAMUSCULAR | Status: AC
  Administered 2014-11-12: 30 mg via INTRAVENOUS

## 2014-11-12 MED ORDER — HYDRALAZINE HCL 10 MG PO TABS
10.0000 mg | ORAL_TABLET | Freq: Three times a day (TID) | ORAL | Status: DC
  Administered 2014-11-12 – 2014-11-13 (×4): 10 mg via ORAL
  Filled 2014-11-12 (×4): qty 1

## 2014-11-12 NOTE — Evaluation (Signed)
Physical Therapy Evaluation Patient Details Name: Melissa Pollard MRN: [id number] DOB: [birth year] Today's Date: 11/12/2014   History of Present Illness  74 y.o. female with past medical history of hypertension, parkinson's disease and related dementia, anxiety and depression who presented to [facility] ED with reports of worsening right knee pain and swelling over last few days s/p fall prior to arrival.  Per ortho note: "CT shows chronic fracture vs post surgical changes although I am not aware of a surgery that would split the patella longitudinally - more likely fracture; KI is ideal but patient's dementia causes poor compliance; WBAT R LE"  Clinical Impression  Pt admitted with above diagnosis. Pt currently with functional limitations due to the deficits listed below (see PT Problem List).  Pt will benefit from skilled PT to increase their independence and safety with mobility to allow discharge to the venue listed below.   Pt with limited evaluation due to cognition.  Pt kept eyes closed however was attempting to participate at times.  Provided multimodal cues however pt still with difficulty following cues.  Pt did not appear in any increased pain during session.     Follow Up Recommendations SNF;Supervision/Assistance - 24 hour    Equipment Recommendations  None recommended by PT    Recommendations for Other Services       Precautions / Restrictions Precautions Precautions: Fall Required Braces or Orthoses: Knee Immobilizer - Right Restrictions Other Position/Activity Restrictions: R LE WBAT      Mobility  Bed Mobility Overal bed mobility: Needs Assistance;+2 for physical assistance Bed Mobility: Supine to Sit;Sit to Supine     Supine to sit: Max assist;HOB elevated Sit to supine: Max assist   General bed mobility comments: pt with LEs in hip/knee flexion upon entering room, multimodal cues for technique, pt assisting at times however variable, pt resting in extension of LEs upon  leaving room  Transfers Overall transfer level:  (not attempted for safety)                  Ambulation/Gait                Stairs            Wheelchair Mobility    Modified Rankin (Stroke Patients Only)       Balance Overall balance assessment: Needs assistance;History of Falls Sitting-balance support: Bilateral upper extremity supported;Feet supported Sitting balance-Leahy Scale: Poor Sitting balance - Comments: pt requiring moderate assist at trunk for external support, improved with cues to maintain upright posture however only briefly Postural control: Posterior lean                                   Pertinent Vitals/Pain Pain Assessment: Faces Faces Pain Scale: Hurts a little bit Pain Intervention(s): Monitored during session;Repositioned    Home Living Family/patient expects to be discharged to:: Skilled nursing facility                      Prior Function Level of Independence: Needs assistance         Comments: pt from facility, poor historian     Hand Dominance        Extremity/Trunk Assessment               Lower Extremity Assessment: RLE deficits/detail RLE Deficits / Details: R knee edema, pt moving all extremities in bed however resistant with passive motion/assist  Communication   Communication: No difficulties  Cognition Arousal/Alertness: Awake/alert (appears awake however kept eyes closed) Behavior During Therapy: Restless Overall Cognitive Status: No family/caregiver present to determine baseline cognitive functioning (hx of dementia)                      General Comments      Exercises        Assessment/Plan    PT Assessment Patient needs continued PT services  PT Diagnosis Difficulty walking   PT Problem List Decreased strength;Decreased balance;Decreased mobility;Decreased safety awareness;Decreased knowledge of use of DME;Decreased cognition  PT Treatment  Interventions DME instruction;Gait training;Functional mobility training;Patient/family education;Therapeutic activities;Wheelchair mobility training;Therapeutic exercise   PT Goals (Current goals can be found in the Care Plan section) Acute Rehab PT Goals PT Goal Formulation: Patient unable to participate in goal setting Time For Goal Achievement: 11/26/14 Potential to Achieve Goals: Fair    Frequency Min 2X/week   Barriers to discharge        Co-evaluation               End of Session Equipment Utilized During Treatment: Gait belt Activity Tolerance: Other (comment) (limited by cognition) Patient left: in bed;with call bell/phone within reach;with bed alarm set;with nursing/sitter in room      Functional Assessment Tool Used: clinical judgement Functional Limitation: Mobility: Walking and moving around Mobility: Walking and Moving Around Current Status ([id number](G8978): At least 60 percent but less than 80 percent impaired, limited or restricted Mobility: Walking and Moving Around Goal Status [phone](G8979): At least 40 percent but less than 60 percent impaired, limited or restricted    Time: 1008-1018 PT Time Calculation (min) (ACUTE ONLY): 10 min   Charges:   PT Evaluation $Initial PT Evaluation Tier I: 1 Procedure     PT G Codes:   PT G-Codes **NOT FOR INPATIENT CLASS** Functional Assessment Tool Used: clinical judgement Functional Limitation: Mobility: Walking and moving around Mobility: Walking and Moving Around Current Status ([id number](G8978): At least 60 percent but less than 80 percent impaired, limited or restricted Mobility: Walking and Moving Around Goal Status [phone](G8979): At least 40 percent but less than 60 percent impaired, limited or restricted    Kendyn Zaman,KATHrine E 11/12/2014, 12:11 PM Zenovia JarredKati Alcide Memoli, PT, DPT 11/12/2014 Pager: [phone]

## 2014-11-12 NOTE — Progress Notes (Signed)
Initial Nutrition Assessment  DOCUMENTATION CODES:   Severe malnutrition in context of chronic illness, Underweight  INTERVENTION:   Continue Ensure Enlive po TID, each supplement provides 350 kcal and 20 grams of protein RD to continue to monitor  NUTRITION DIAGNOSIS:   Malnutrition related to chronic illness as evidenced by percent weight loss, mild depletion of body fat, severe depletion of muscle mass.  GOAL:   Patient will meet greater than or equal to 90% of their needs  MONITOR:   PO intake, Supplement acceptance, Labs, Weight trends, Skin, I & O's  ASSESSMENT:   74 y.o. female with past medical history of hypertension, parkinson's disease and related dementia, anxiety and depression who [facility] with reports of worsening right knee pain and swelling over last few days. Patient fell a few days ago and again 1 day prior to this admission.  Pt in room asleep with no family in room. No PO intake documented in chart. Pt with history of dysphagia diet. Would recommend SLP evaluation. Per weight history, pt has lost 6 lb since 10/14 (5% weight loss x 3 weeks, significant for time frame).  Nutrition-Focused physical exam completed. Findings are mild fat depletion, severe muscle depletion, and no edema.   Labs reviewed: Elevated BUN  Diet Order:  Diet regular Room service appropriate?: Yes; Fluid consistency:: Thin  Skin:  Reviewed, no issues  Last BM:  PTA  Height:   Ht Readings from Last 1 Encounters:  11/11/14 5\' 5"  (1.651 m)    Weight:   Wt Readings from Last 1 Encounters:  11/11/14 107 lb 12.8 oz (48.898 kg)    Ideal Body Weight:  56.8 kg  BMI:  Body mass index is 17.94 kg/(m^2).  Estimated Nutritional Needs:   Kcal:  1400-1600  Protein:  60-70g  Fluid:  1.6L/day  EDUCATION NEEDS:   No education needs identified at this time  Tilda FrancoLindsey Zaryiah Barz, MS, RD, LDN Pager: [phone] After Hours Pager: [phone]

## 2014-11-12 NOTE — Care Management Obs Status (Signed)
MEDICARE OBSERVATION STATUS NOTIFICATION   Patient Details  Name: Melissa Pollard MRN: [id number] Date of Birth: 03/04/1940   Medicare Observation Status Notification Given:  Yes    MahabirOlegario Messier, Marieli Rudy, RN 11/12/2014, 3:24 PM

## 2014-11-12 NOTE — Progress Notes (Addendum)
   11/12/14 1900  What Happened  Was fall witnessed? No  Was patient injured? No  Patient found on floor  Found by Staff-comment Enriqueta Shutter(Alexee Delsanto and Sharol Rousseliana Torres)  Stated prior activity other (comment)  Follow Up  MD notified yes  Time MD notified [id number]  Family notified Yes-comment (husband arrived around [id number])  Time family notified 1925  Additional tests No  Simple treatment Other (comment) (low bed ordered and mats on the floor)  Adult Fall Risk Assessment  Risk Factor Category (scoring not indicated) History of more than one fall within 6 months before admission (document High fall risk)  Patient's Fall Risk High Fall Risk (>13 points)  Adult Fall Risk Interventions  Required Bundle Interventions *See Row Information* High fall risk - low, moderate, and high requirements implemented  Additional Interventions Individualized elimination schedule;Room near nurses station;Secure all tubes/drains;Specialty bed:  Low bed  Fall with Injury Screening  Risk For Fall Injury- See Row Information  Nurse judgement;F;D  Intervention(s) for 2 or more risk criteria identified Low Bed   Patient was found sitting on floor screaming after bed alarm went off.  Lafonda MossesDiana and I got to the room at the same time.   We slowly lifted her and got her back in bed.  Patient was checked and did not appear to to have any wounds or redness.  We moved the patient closer to the main desk into room 1332 from 1328.  The day tech stayed with the patient while I did the post falls huddle.  In the middle of the post falls huddle, the husband came onto the floor and I called him and told him that we moved his wife to [id number].  I then went to the room to inform him that she had fallen onto the floor and we got her back into the bed and that is why we moved her.  The patient at this time was saying things out of her head and was throwing herself around in the bed. I then left the room and contacted the House coverage Maren ReamerKaren Kirby.   House coverage, Maren ReamerKaren Kirby returned my call and told me to watch her closely check her bottom and because she is not aware of things, watch her for vomiting as well as watching her pupils and if anything is a problem, to call her. Rubye OaksPerkia, Azyah Flett Jean

## 2014-11-12 NOTE — Evaluation (Signed)
Occupational Therapy Evaluation Patient Details Name: Melissa Pollard MRN: [id number] DOB: 12/11/1940 Today's Date: 11/12/2014    History of Present Illness 74 y.o. female with past medical history of hypertension, parkinson's disease and related dementia, anxiety and depression who [facility] with reports of worsening right knee pain and swelling over last few days s/p fall prior to arrival.  Per ortho note: "CT shows chronic fracture vs post surgical changes although I am not aware of a surgery that would split the patella longitudinally - more likely fracture; KI is ideal but patient's dementia causes poor compliance; WBAT R LE"   Clinical Impression   Pt admitted with above. She demonstrates the below listed deficits and will benefit from continued OT to maximize safety and independence with BADLs.  Pt lethargic with very minimal participation.  Follows no commands, and requires total A for all aspects of ADLs and functional mobility.  She will need SNF.       Follow Up Recommendations  SNF;Supervision/Assistance - 24 hour    Equipment Recommendations  None recommended by OT    Recommendations for Other Services       Precautions / Restrictions Precautions Precautions: Fall Required Braces or Orthoses: Knee Immobilizer - Right Restrictions Weight Bearing Restrictions: No Other Position/Activity Restrictions: R LE WBAT      Mobility Bed Mobility Overal bed mobility: Needs Assistance Bed Mobility: Rolling Rolling: Total assist            Transfers                      Balance                                            ADL Overall ADL's : Needs assistance/impaired Eating/Feeding: Total assistance Eating/Feeding Details (indicate cue type and reason): required total A to move cup to mouth.  Made no attempt to drink from straw  Grooming: Wash/dry hands;Wash/dry face;Oral care;Brushing hair;Total assistance;Bed level   Upper Body  Bathing: Total assistance   Lower Body Bathing: Total assistance   Upper Body Dressing : Total assistance   Lower Body Dressing: Total assistance   Toilet Transfer: Total assistance   Toileting- Clothing Manipulation and Hygiene: Total assistance       Functional mobility during ADLs: Total assistance General ADL Comments: Bed level eval only due to lethargy      Vision     Perception     Praxis      Pertinent Vitals/Pain Pain Assessment: Faces Faces Pain Scale: No hurt     Hand Dominance     Extremity/Trunk Assessment Upper Extremity Assessment Upper Extremity Assessment: RUE deficits/detail;LUE deficits/detail RUE Deficits / Details: Pt with spontaneous movement elbow and hands, but unable to perform movement on command.  Tremors noted as well as rigidity.  full PROM  LUE Deficits / Details: Pt with spontaneous movement elbow and hands, but unable to perform movement on command.  Tremors noted as well as rigidity.  full PROM    Lower Extremity Assessment Lower Extremity Assessment: Defer to PT evaluation       Communication Communication Communication: Expressive difficulties (per neurology visit notes pt with communication deficits )   Cognition Arousal/Alertness: Lethargic Behavior During Therapy: Flat affect Overall Cognitive Status: Impaired/Different from baseline Area of Impairment: Attention   Current Attention Level: Focused  General Comments: pt with very minimal arousal.  followed no commands    General Comments       Exercises       Shoulder Instructions      Home Living Family/patient expects to be discharged to:: Skilled nursing facility                                 Additional Comments: Pt previously resided in memory care unit at ALF       Prior Functioning/Environment Level of Independence: Needs assistance  Gait / Transfers Assistance Needed: Per chart review, it appears pt was ambulatory with  frequent falls, but no family present to confirm ADL's / Homemaking Assistance Needed: No family present to confirm level of assist needed for ADls         OT Diagnosis: Generalized weakness;Cognitive deficits;Apraxia   OT Problem List: Decreased strength;Decreased range of motion;Decreased activity tolerance;Impaired balance (sitting and/or standing);Decreased coordination;Decreased cognition;Decreased safety awareness;Decreased knowledge of use of DME or AE;Impaired UE functional use   OT Treatment/Interventions: Self-care/ADL training;Therapeutic exercise;Neuromuscular education;DME and/or AE instruction;Therapeutic activities;Cognitive remediation/compensation;Patient/family education;Balance training    OT Goals(Current goals can be found in the care plan section) Acute Rehab OT Goals OT Goal Formulation: Patient unable to participate in goal setting Time For Goal Achievement: 11/26/14 Potential to Achieve Goals: Fair ADL Goals Pt Will Perform Grooming: with mod assist;sitting Pt Will Transfer to Toilet: with mod assist;bedside commode;stand pivot transfer  OT Frequency: Min 2X/week   Barriers to D/C: Decreased caregiver support          Co-evaluation              End of Session Nurse Communication: Mobility status  Activity Tolerance: Patient limited by lethargy Patient left: in bed;with call bell/phone within reach;with bed alarm set;with nursing/sitter in room   Time: 1610-9604 OT Time Calculation (min): 20 min Charges:  OT General Charges $OT Visit: 1 Procedure OT Evaluation $Initial OT Evaluation Tier I: 1 Procedure G-Codes:    Jeani Hawking M 11-16-14, 5:52 PM

## 2014-11-12 NOTE — Clinical Social Work Note (Signed)
Clinical Social Work Assessment  Patient Details  Name: Melissa PotterBetsy Pollard MRN: [id number] Date of Birth: 02/21/1940  Date of referral:  11/12/14               Reason for consult:  Discharge Planning                Permission sought to share information with:  Family Supports Permission granted to share information::     Name::     Melissa Pollard  Agency::     Relationship::  husband  Contact Information:  [phone]  Housing/Transportation Living arrangements for the past 2 months:  Assisted Living Facility Source of Information:  Spouse Patient Interpreter Needed:  None Criminal Activity/Legal Involvement Pertinent to Current Situation/Hospitalization:  No - Comment as needed Significant Relationships:  Spouse Lives with:  Facility Resident Do you feel safe going back to the place where you live?  No Need for family participation in patient care:  Yes (Comment)  Care giving concerns:  [facility]. Pt requiring maximal assist upon PT eval and recommendation SNF.   Social Worker assessment / plan:  CSW received referral for New SNF.  CSW visited pt room and no family present at bedside. CSW contacted pt husband, Melissa PraderHeath via telephone. CSW introduced self and explained role. Pt husband confirmed that pt admitted from [facility]. Pt husband expressed that he is concerned that pt would not be able to return to [facility] with the assistance that she is currently requiring. CSW provided support and discussed that PT recommendation reflected need [facility]. Pt [place] agree[facility] [place] [facility] [place]. Pt husband expressed that he is hopeful that pt parkinson's medication will be resumed soon as pt often has difficult time managing with parkinson's medications is withheld.   CSW completed FL2 and [place] [facility] [place] [facility]. CSW to follow up with pt husband re: SNF bed offers.   CSW noted that pt had safety sitter  in place due to confusion overnight. CSW confirmed with RN that safety sitter has been discontinued.   CSW to continue to follow to provide support and assist with pt disposition planning.   Employment status:  Retired Database administratornsurance information:  Managed Medicare PT Recommendations:  Skilled Nursing Facility Information / Referral to community resources:  Skilled Nursing Facility  Patient/Family's Response to care:  Pt sleeping when CSW visited room and per chart, pt oriented to person only. Pt husband supportive and actively involved in pt care. Pt husband recognizes that SNF level of care is going to be most appropriate plan upon discharge from the hospital.  Patient/Family's Understanding of and Emotional Response to Diagnosis, Current Treatment, and Prognosis:  Pt husband knowledgeable about pt diagnosis and treatment plan. Pt husband is concerned that pt is a potential discharge tomorrow and hopeful that pt shows improvement before tomorrow.   Emotional Assessment Appearance:  Appears stated age Attitude/Demeanor/Rapport:  Unable to Assess Affect (typically observed):  Unable to Assess Orientation:  Oriented to Self Alcohol / Substance use:  Not Applicable Psych involvement (Current and /or in the community):  No (Comment)  Discharge Needs  Concerns to be addressed:  Discharge Planning Concerns Readmission within the last 30 days:  No Current discharge risk:  Physical Impairment Barriers to Discharge:  Continued Medical Work up   Loletta SpecterKIDD, SUZANNA A, LCSW 11/12/2014, 2:03 PM  [phone]

## 2014-11-12 NOTE — Care Management Note (Signed)
Case Management Note  Patient Details  Name: Melissa PotterBetsy Pollard MRN: [id number] Date of Birth: 12/21/1940  Subjective/Objective: 74 y/o f admitted w/R knee effusion. From SNF. CSW already following.                  Action/Plan:d/c plan SNF.   Expected Discharge Date:                  Expected Discharge Plan:  Skilled Nursing Facility  In-House Referral:  Clinical Social Work  Discharge planning Services  CM Consult  Post Acute Care Choice:    Choice offered to:     DME Arranged:    DME Agency:     HH Arranged:    HH Agency:     Status of Service:  In process, will continue to follow  Medicare Important Message Given:    Date Medicare IM Given:    Medicare IM give by:    Date Additional Medicare IM Given:    Additional Medicare Important Message give by:     If discussed at Long Length of Stay Meetings, dates discussed:    Additional Comments:  Lanier ClamMahabir, Jaasiel [place], RN 11/12/2014, 1:51 PM

## 2014-11-12 NOTE — Progress Notes (Addendum)
Patient ID: Melissa PotterBetsy Pollard, female   DOB: 09/16/1940, 74 y.o.   MRN: [id number] [facility] PROGRESS NOTE  Melissa PotterBetsy Pollard MVH:[id number] DOB: 03/04/1940 DOA: 11/11/2014 PCP: Herschel SenegalHABER, MICHELE, MD  Brief narrative:    74 y.o. female with past medical history of hypertension, parkinson's disease and related dementia, anxiety and depression who [facility] with reports of worsening right knee pain and swelling over last few days. Patient fell a few days ago and again 1 day prior to this admission.  In ED, right knee immobilizer placed. Blood work showed WBC count 11, hemoglobin 10.8, potassium 5.2, Cr 1.33. X ray of the right knee showed no acute fracture which was confirmed with CT scan. It did however show large suprapatellar knee joint effusion. Hip x ray also showed right pubic ramus fracture. CXR showed no acute findings. She was admitted for further evaluation and management of her knee pain and knee effusion.  Anticipated discharge: likely by 11/13/2014  If pain better controlled. Plan for arthrocentesis today.  Assessment/Plan:    Principal Problem:  Effusion of right knee / Fall - Appreciate IR help on doing arthrocentesis - X-ray and CT scan confirms large suprapatellar knee joint effusion the right knee. - No evidence of acute infection. - Per orthopedic surgery, no  Requirement for surgical intervention.  Weightbearing as tolerated. - Continue pain management efforts  Active Problems:  Benign essential HTN - Add hydralazine 10 mg every 8 hours for better blood pressure control. She is also on as needed hydralazine for blood pressure above 150/90.   Hyperkalemia - Unclear etiology - repeat potassium within normal limits.   Leukocytosis - Likely reactive - no evidence of acute infection on x-ray or CT scan   CKD (chronic kidney disease) stage 3, GFR 30-59 ml/min - Creatinine 1.3 on admission - Creatinine has normalized with IV fluids.   Anemia of chronic kidney  failure - Hemoglobin stable at 10.1 - No evidence of bleeding   Anxiety and depression - Continue xanax and Zoloft  - Stable, not depressed.   Parkinson disease (HCC) - Continue sinemet - Appreciate physical therapy evaluation recommendation   Severe protein-calorie malnutrition (HCC) / Underweight - Body mass index is 17.94 kg/(m^2).  - Seen by dietician    DVT Prophylaxis  - SCD's bilaterally   Code Status: Full.  Family Communication:  plan of care discussed with the patient Disposition Plan: need PT eval, D/C likely 11/8  IV access:  Peripheral IV  Procedures and diagnostic studies:    Dg Chest 1 View 11/11/2014 No acute cardiopulmonary findings and no definite acute fractures.  Ct Knee Right Wo Contrast 11/11/2014 1. Longitudinally split patella appears to be a remote postsurgical finding and not an acute fracture. 2. Large suprapatellar knee joint effusion and small loose bodies. 3. Tricompartmental degenerative changes. No acute fracture.   Dg Knee Complete 4 Views Right 11/11/2014 Longitudinal fracture through the patella with distraction of the fragments. 2. Moderate-sized effusion. 3. Mild degenerative changes.  Dg Hip Unilat With Pelvis 2-3 Views Right 11/11/2014 1. Limited study demonstrating what appears to be a parasymphyseal fracture of the right pubic bone.   Medical Consultants:  Ortho  Other Consultants:  PT Nutrition  IAnti-Infectives:   None    Manson PasseyEVINE, [name], MD  [facility] Pager [phone]  Time spent in minutes: 25 minutes  If 7PM-7AM, please contact night-coverage [email or web address] Password [email or web address] 11/12/2014, 10:02 AM      HPI/Subjective: Lots of pain in the right knee overnight but  now calm, pain controlled.   Objective: Filed Vitals:   11/11/14 1330 11/11/14 1428 11/11/14 2005 11/12/14 0527  BP: 149/84 183/101 109/58 165/92  Pulse:  75 69 66  Temp:  97.9 F (36.6 C) 97.6 F (36.4 C) 97.5 F (36.4 C)  TempSrc:   Axillary Oral Axillary  Resp: Height:   (1.651 m)    Weight:  48.898 kg (107 lb 12.8 oz)    SpO2:  100% 96% 100%   No intake or output data in the 24 hours ending 11/12/14 1002  Exam:   General:  Pt is alert, follows commands appropriately, not in acute distress  Cardiovascular: Regular rate and rhythm, S1/S2, no murmurs  Respiratory: Clear to auscultation bilaterally, no wheezing, no crackles, no rhonchi  Abdomen: Soft, non tender, non distended, bowel sounds present  Extremities:right knee swollen with tenderness to palpation, appreciate pulses   Neuro: Grossly nonfocal  Data Reviewed: Basic Metabolic Panel:  Recent Labs Lab 11/11/14 1056 11/11/14 1808 11/12/14 0540  NA 140 138 141  K 5.2* 4.0 4.4  CL 107 109 111  CO2 GLUCOSE 113* 109* 99  BUN 33* 28* 28*  CREATININE 1.33* 1.20* 0.99  CALCIUM 9.3 8.8* 8.7*   Liver Function Tests:  Recent Labs Lab 11/11/14 1056  AST 23  ALT 6*  ALKPHOS 120  BILITOT 1.2  PROT 6.7  ALBUMIN 4.1   No results for input(s): LIPASE, AMYLASE in the last 168 hours. No results for input(s): AMMONIA in the last 168 hours. CBC:  Recent Labs Lab 11/11/14 1056 11/12/14 0540  WBC 11.0* 8.8  NEUTROABS 7.1  --   HGB 10.8* 10.1*  HCT 33.2* 32.2*  MCV 96.8 100.3*  PLT 283 227   Cardiac Enzymes: No results for input(s): CKTOTAL, CKMB, CKMBINDEX, TROPONINI in the last 168 hours. BNP: Invalid input(s): POCBNP CBG:  Recent Labs Lab 11/12/14 0805  GLUCAP 80    No results found for this or any previous visit (from the past 240 hour(s)).   Scheduled Meds: . acetaminophen  500 mg Oral QID  . acidophilus  1 capsule Oral Daily  . ALPRAZolam  0.25 mg Oral BID  . carbidopa-levodopa  1 tablet Oral QHS  . carbidopa-levodopa  1 tablet Oral 5 X Daily  . feeding supplement (ENSURE ENLIVE)  237 mL Oral TID AC & HS  . sertraline  75 mg Oral Daily   Continuous Infusions: . sodium chloride 75 mL/hr at  11/11/14 1532

## 2014-11-12 NOTE — Progress Notes (Signed)
Pt rested for approximately 1.5 hours after last dose of ativan, but woke up around 0200 agitated, restless, and yelling out again. Changed bed due to urine incontinence and given ativan 0.5mg  and toradol per order. Called rapid response to assess pt for further recommendations as she continues to be agitated and confused beyond baseline. Only recommendation was to try IV robaxin for possible spasms related to hip fracture. Pt calmer at this time and husband remains at bedside. Mick SellShannon Shontez Sermon RN

## 2014-11-12 NOTE — Clinical Social Work Placement (Signed)
   CLINICAL SOCIAL WORK PLACEMENT  NOTE  Date:  11/12/2014  Patient Details  Name: Melissa Pollard MRN: [id number] Date of Birth: 05/02/1940  Clinical Social Work is seeking post-discharge placement for this patient at the Skilled  Nursing Facility level of care (*CSW will initial, date and re-position this form in  chart as items are completed):  Yes   Patient/family provided with [place] Clinical Social Work Department's list of facilities offering this level of care within the geographic area requested by the patient (or if unable, by the patient's family).  Yes   Patient/family informed of their freedom to choose among providers that offer the needed level of care, that participate in Medicare, Medicaid or managed care program needed by the patient, have an available bed and are willing to accept the patient.  Yes   Patient/family informed of 's ownership interest in [facility] and [facility], as well as of the fact that they are under no obligation to receive care at these facilities.  PASRR submitted to EDS on 11/12/14     PASRR number received on       Existing PASRR number confirmed on       FL2 transmitted to all facilities in geographic area requested by pt/family on 11/12/14     FL2 transmitted to all facilities within larger geographic area on       Patient informed that his/her managed care company has contracts with or will negotiate with certain facilities, including the following:            Patient/family informed of bed offers received.  Patient chooses bed at       Physician recommends and patient chooses bed at      Patient to be transferred to   on  .  Patient to be transferred to facility by       Patient family notified on   of transfer.  Name of family member notified:        PHYSICIAN Please sign FL2     Additional Comment:    _______________________________________________ Orson EvaKIDD, Melissa Bevans A, LCSW 11/12/2014, 2:12 PM

## 2014-11-12 NOTE — Consult Note (Signed)
ORTHOPAEDIC CONSULTATION  REQUESTING PHYSICIAN: Melissa MurrayAlma M Devine, MD  Chief Complaint: Right patella fx, pelvic fx  HPI: Melissa OddiBetsy Pollard is a 74 y.o. female who presents with right knee pain and swelling and pelvic pain s/p mechanical fall.  The patient has severe dementia, parkinson's, anxiety, depression.  History is limited secondary to dementia.  ROS unable to be performed due to dementia.  Ortho consulted.  History reviewed. No pertinent past medical history. Past Surgical History  Procedure Laterality Date  . Dilation and curettage of uterus    . Tonsillectomy    . Colonoscopy  2002    Negative   Social History   Social History  . Marital Status: Married    Spouse Name: Melissa Pollard  . Number of Children: 2  . Years of Education: MA   Occupational History  . Retired    Social History Main Topics  . Smoking status: Former Smoker -- 0.20 packs/day for 25 years    Types: Cigarettes    Quit date: 01/06/1995  . Smokeless tobacco: Never Used  . Alcohol Use: No  . Drug Use: No  . Sexual Activity: Not Asked   Other Topics Concern  . 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 Stroke Father   . Hypertension Father   . Heart failure Father   . Asthma Father   . Diabetes Cousin   . Stroke Mother   . Hypertension Mother   . Breast cancer Mother [id number]   No Known Allergies Prior to Admission medications   Medication Sig Start Date End Date Taking? Authorizing Provider  acetaminophen (TYLENOL) 500 MG tablet Take 500 mg by mouth 4 (four) times daily. 0600, 1000, 1400, 1800   Yes Historical Provider, MD  ALPRAZolam (XANAX) 0.25 MG tablet Take by mouth 2 (two) times daily.  10/04/14  Yes Historical Provider, MD    carbidopa-levodopa (SINEMET CR) 50-200 MG tablet Take 1 tablet by mouth at bedtime. 10/19/14  Yes Rebecca S Tat, DO  carbidopa-levodopa (SINEMET IR) 25-250 MG tablet Take 1 tablet by mouth 5 (five) times daily. 0600, 0900, 1200, 1500, 1800 10/07/14  Yes Historical Provider, MD  celecoxib (CELEBREX) 100 MG capsule Take 100 mg by mouth 2 (two) times daily.   Yes Historical Provider, MD  feeding supplement (ENSURE COMPLETE) LIQD Take 237 mLs by mouth 2 (two) times daily between meals. Patient taking differently: Take 237 mLs by mouth 4 (four) times daily -  before meals and at bedtime.  05/27/12  Yes Pasty Spillersracy N McLean-Scocozza, MD  Melatonin 3 MG CAPS Take by mouth at bedtime as needed and may repeat dose one time if needed.   Yes Historical Provider, MD  mirtazapine (REMERON) 15 MG tablet Take 15 mg by mouth at bedtime.  10/07/14  Yes Historical Provider, MD  Probiotic Product (ULTRAFLORA IMMUNE HEALTH PO) Take 1 tablet by mouth daily.   Yes Historical Provider, MD  sertraline (ZOLOFT) 50 MG tablet Take 75 mg by mouth daily.  02/02/11  Yes Pecola LawlessWilliam F Hopper, MD   Dg Chest 1 View  11/11/2014  CLINICAL DATA:  Larey SeatFell today.  Right-sided pain. EXAM: CHEST 1 VIEW COMPARISON:  05/27/2012  FINDINGS: The heart is borderline enlarged but stable. There is moderate tortuosity and ectasia of the thoracic aorta which is stable. The lungs are clear. No pleural effusion. The bony thorax is intact. IMPRESSION: No acute cardiopulmonary findings and no definite acute fractures. Electronically Signed   By: Rudie Meyer M.D.   On: 11/11/2014 11:47   Ct Knee Right Wo Contrast  11/11/2014  CLINICAL DATA:  Larey Seat today.  Knee pain and swelling. EXAM: CT OF THE right KNEE WITHOUT CONTRAST TECHNIQUE: Multidetector CT imaging of the right knee was performed according to the standard protocol. Multiplanar CT image reconstructions were also generated. COMPARISON:  Radiographs 11/11/2014 FINDINGS: The patella is split into 2 parts  longitudinally. However, this does not have the appearance of an acute fracture. It is much more likely some type of prior surgical procedure with a sharp, corticated smooth margin. There also appears to be a defect in the quadriceps tendon. The patella tendon appears to be intact to both halves of the patella. Advanced tricompartmental degenerative changes with joint space narrowing, osteophytic spurring and subchondral cystic change. Very large joint effusion with small loose bodies. No fracture of the tibia, femur or fibula is identified. Chondrocalcinosis is noted. IMPRESSION: 1. Longitudinally split patella appears to be a remote postsurgical finding and not an acute fracture. 2. Large suprapatellar knee joint effusion and small loose bodies. 3. Tricompartmental degenerative changes.  No acute fracture. Electronically Signed   By: Rudie Meyer M.D.   On: 11/11/2014 14:23   Dg Knee Complete 4 Views Right  11/11/2014  CLINICAL DATA:  Right knee swelling for the past 2 days. Worse after falling this morning. EXAM: RIGHT KNEE - COMPLETE 4+ VIEW COMPARISON:  None. FINDINGS: Diffuse anterior soft tissue swelling. Lodged signal fracture through the patella with distraction of the fragments. This is mildly comminuted and some of the margins are poorly defined. There is an associated moderate-sized effusion. Also noted is mild tricompartmental spur formation. IMPRESSION: 1. Longitudinal fracture through the patella with distraction of the fragments. 2. Moderate-sized effusion. 3. Mild degenerative changes. Electronically Signed   By: Beckie Salts M.D.   On: 11/11/2014 11:41   Dg Hip Unilat With Pelvis 2-3 Views Right  11/11/2014  CLINICAL DATA:  74 year old female with a history a trauma from a fall landing onto her right side complaining of right-sided hip pain. EXAM: DG HIP (WITH OR WITHOUT PELVIS) 2-3V RIGHT COMPARISON:  No priors. FINDINGS: Study is limited by atypical projections (patient is consistently  rotated to the right). Despite this limitation, there is apparent cortical disruption in the parasymphyseal region of the right pubic bone. Other visualized portions of the pelvis appear intact. Right proximal femur appears intact, although images demonstrate essentially the same projection, which limits assessment. Right femoral head is properly located. IMPRESSION: 1. Limited study demonstrating what appears to be a parasymphyseal fracture of the right pubic bone. Electronically Signed   By: Trudie Reed M.D.   On: 11/11/2014 11:48    Positive ROS: All other systems have been reviewed and were otherwise negative with the exception of those mentioned in the HPI and as above.  Physical Exam: General: NAD Cardiovascular: No pedal edema Respiratory: No cyanosis, no use of accessory musculature GI: No organomegaly, abdomen is soft and non-tender Skin: No lesions in the area of chief complaint Neurologic: Sensation intact distally Psychiatric: Patient is severely demented, nonverbal Lymphatic: No axillary or cervical lymphadenopathy  MUSCULOSKELETAL:  - effusion of right knee, patient withdraws knee to pain,  no signs of infection - pelvic brim also ttp - foot wwp, wiggling toes  Assessment: 1. Chronic patellar fracture 2. Rami fractures  Plan: - CT shows chronic fracture vs post surgical changes although I am not aware of a surgery that would split the patella longitudinally - more likely fracture - WBAT RLE - KI is ideal but patient's dementia causes poor compliance - may follow up as outpatient in 1-2 weeks  Thank you for the consult and the opportunity to see Ms. Legore  N. Glee Arvin, MD [facility] [phone] 7:45 AM

## 2014-11-12 NOTE — Progress Notes (Signed)
RN paged this NP earlier because pt was found in sitting position beside of her bed on the floor. Pt has a baseline of Parkinson's so her neuro status (orientation) will be difficult to assess. Per RN, pt has no markings, redness, skin breaks or other injuries noted. No signs of trauma to head. Asked RN to check pt frequently, assess pupils and watch pt for any nausea or vomiting which could indicate head issue. Also, call if acute change in mental status.  Jimmye NormanKaren Kirby-Graham, NP [facility]

## 2014-11-12 NOTE — NC FL2 (Signed)
[place] MEDICAID FL2 LEVEL OF CARE SCREENING TOOL     IDENTIFICATION  Patient Name: Melissa OddiBetsy Pollard Birthdate: 10/23/1940 Sex: female Admission Date (Current Location): 11/11/2014  [facility] [place]   Facility and Address:  [facility],  [address], [place] [id number]      Provider Number: [id number]  Attending Physician Name and Address:  Alison MurrayAlma M Devine, MD  Relative Name and Phone Number:       Current Level of Care: Hospital Recommended Level of Care: Skilled Nursing Facility Prior Approval Number:    Date Approved/Denied:   PASRR Number:    Discharge Plan: SNF    Current Diagnoses: Patient Active Problem List   Diagnosis Date Noted  . Fall 11/11/2014  . Effusion of right knee 11/11/2014  . Benign essential HTN 11/11/2014  . Hyperkalemia 11/11/2014  . Leukocytosis 11/11/2014  . CKD (chronic kidney disease) stage 3, GFR 30-59 ml/min 11/11/2014  . Anemia of chronic kidney failure 11/11/2014  . Anxiety and depression 11/11/2014  . Parkinson disease (HCC) 11/11/2014  . Severe protein-calorie malnutrition (HCC) 11/11/2014  . Underweight 11/11/2014    Orientation ACTIVITIES/SOCIAL BLADDER RESPIRATION    Self    Incontinent Normal  BEHAVIORAL SYMPTOMS/MOOD NEUROLOGICAL BOWEL NUTRITION STATUS      Continent Diet (Regular Diet)  PHYSICIAN VISITS COMMUNICATION OF NEEDS Height & Weight Skin    Verbally 5\' 5"  (165.1 cm) 107 lbs. Normal          AMBULATORY STATUS RESPIRATION    Assist extensive Normal      Personal Care Assistance Level of Assistance  Bathing, Dressing, Feeding Bathing Assistance: Maximum assistance Feeding assistance: Maximum assistance Dressing Assistance: Maximum assistance      Functional Limitations Info  Speech     Speech Info: Impaired       SPECIAL CARE FACTORS FREQUENCY  PT (By licensed PT), OT (By licensed OT)     PT Frequency: 5 x week OT Frequency: 5 x week            Additional Factors Info  Code Status, Allergies, Psychotropic Code Status Info: FULL code status Allergies Info: No Known Allergies           Current Medications (11/12/2014): Current Facility-Administered Medications  Medication Dose Route Frequency Provider Last Rate Last Dose  . 0.9 %  sodium chloride infusion   Intravenous Continuous Alison MurrayAlma M Devine, MD 75 mL/hr at 11/11/14 1532    . acetaminophen (TYLENOL) tablet 500 mg  500 mg Oral QID Alison MurrayAlma M Devine, MD   500 mg at 11/11/14 2039  . acidophilus (RISAQUAD) capsule 1 capsule  1 capsule Oral Daily Alison MurrayAlma M Devine, MD   1 capsule at 11/11/14 1554  . ALPRAZolam Prudy Feeler(XANAX) tablet 0.25 mg  0.25 mg Oral BID Alison MurrayAlma M Devine, MD   0.25 mg at 11/11/14 2039  . carbidopa-levodopa (SINEMET CR) 50-200 MG per tablet controlled release 1 tablet  1 tablet Oral QHS Alison MurrayAlma M Devine, MD   1 tablet at 11/11/14 2039  . carbidopa-levodopa (SINEMET IR) 25-250 MG per tablet immediate release 1 tablet  1 tablet Oral 5 X Daily Alison MurrayAlma M Devine, MD   1 tablet at 11/11/14 1834  . feeding supplement (ENSURE ENLIVE) (ENSURE ENLIVE) liquid 237 mL  237 mL Oral TID AC & HS Alison MurrayAlma M Devine, MD   237 mL at 11/11/14 2200  . fentaNYL (SUBLIMAZE) injection 50 mcg  50 mcg Intravenous Q2H PRN Gerome ApleyLaura A Harduk, PA-C   50 mcg  at 11/11/14 2107  . hydrALAZINE (APRESOLINE) injection 5 mg  5 mg Intravenous Q6H PRN Alison Murray, MD      . hydrALAZINE (APRESOLINE) tablet 10 mg  10 mg Oral 3 times per day Alison Murray, MD      . LORazepam (ATIVAN) injection 0.5 mg  0.5 mg Intravenous Q4H PRN Gerome Apley Harduk, PA-C   0.5 mg at 11/12/14 4098  . methocarbamol (ROBAXIN) 500 mg in dextrose 5 % 50 mL IVPB  500 mg Intravenous Q8H PRN Gerome Apley Harduk, PA-C   500 mg at 11/12/14 0314  . ondansetron (ZOFRAN) tablet 4 mg  4 mg Oral Q6H PRN Alison Murray, MD       Or  . ondansetron [facility]) injection 4 mg  4 mg Intravenous Q6H PRN Alison Murray, MD      . oxyCODONE (Oxy IR/ROXICODONE) immediate release tablet 5 mg   5 mg Oral Q4H PRN Gerome Apley Harduk, PA-C      . sertraline (ZOLOFT) tablet 75 mg  75 mg Oral Daily Alison Murray, MD   75 mg at 11/12/14 1000   Do not use this list as official medication orders. Please verify with discharge summary.  Discharge Medications:   Medication List    ASK your doctor about these medications        acetaminophen 500 MG tablet  Commonly known as:  TYLENOL  Take 500 mg by mouth 4 (four) times daily. 0600, 1000, 1400, 1800     ALPRAZolam 0.25 MG tablet  Commonly known as:  XANAX  Take by mouth 2 (two) times daily.     carbidopa-levodopa 25-250 MG tablet  Commonly known as:  SINEMET IR  Take 1 tablet by mouth 5 (five) times daily. 0600, 0900, 1200, 1500, 1800     carbidopa-levodopa 50-200 MG tablet  Commonly known as:  SINEMET CR  Take 1 tablet by mouth at bedtime.     celecoxib 100 MG capsule  Commonly known as:  CELEBREX  Take 100 mg by mouth 2 (two) times daily.     feeding supplement (ENSURE COMPLETE) Liqd  Take 237 mLs by mouth 2 (two) times daily between meals.     Melatonin 3 MG Caps  Take by mouth at bedtime as needed and may repeat dose one time if needed.     mirtazapine 15 MG tablet  Commonly known as:  REMERON  Take 15 mg by mouth at bedtime.     sertraline 50 MG tablet  Commonly known as:  ZOLOFT  Take 75 mg by mouth daily.     ULTRAFLORA IMMUNE HEALTH PO  Take 1 tablet by mouth daily.        Relevant Imaging Results:  Relevant Lab Results:  Recent Labs    Additional Information SSN: [id number]  Aziyah Provencal A, LCSW

## 2014-11-13 ENCOUNTER — Ambulatory Visit: Payer: Medicare Other | Admitting: Neurology

## 2014-11-13 ENCOUNTER — Observation Stay (HOSPITAL_COMMUNITY): Payer: Medicare Other

## 2014-11-13 DIAGNOSIS — G20 Parkinson's disease: Secondary | ICD-10-CM | POA: Diagnosis not present

## 2014-11-13 DIAGNOSIS — Z66 Do not resuscitate: Secondary | ICD-10-CM

## 2014-11-13 DIAGNOSIS — S82001A Unspecified fracture of right patella, initial encounter for closed fracture: Secondary | ICD-10-CM

## 2014-11-13 DIAGNOSIS — I10 Essential (primary) hypertension: Secondary | ICD-10-CM | POA: Diagnosis not present

## 2014-11-13 DIAGNOSIS — N183 Chronic kidney disease, stage 3 (moderate): Secondary | ICD-10-CM | POA: Diagnosis not present

## 2014-11-13 DIAGNOSIS — Z515 Encounter for palliative care: Secondary | ICD-10-CM

## 2014-11-13 DIAGNOSIS — F418 Other specified anxiety disorders: Secondary | ICD-10-CM | POA: Diagnosis not present

## 2014-11-13 DIAGNOSIS — R451 Restlessness and agitation: Secondary | ICD-10-CM

## 2014-11-13 DIAGNOSIS — M25461 Effusion, right knee: Secondary | ICD-10-CM | POA: Diagnosis not present

## 2014-11-13 LAB — GLUCOSE, CAPILLARY: Glucose-Capillary: 87 mg/dL (ref 65–99)

## 2014-11-13 MED ORDER — QUETIAPINE FUMARATE 50 MG PO TABS
25.0000 mg | ORAL_TABLET | Freq: Two times a day (BID) | ORAL | Status: DC
  Administered 2014-11-13 – 2014-11-14 (×3): 25 mg via ORAL
  Filled 2014-11-13 (×3): qty 1

## 2014-11-13 MED ORDER — LORAZEPAM 2 MG/ML IJ SOLN
1.0000 mg | INTRAMUSCULAR | Status: DC | PRN
  Administered 2014-11-13 – 2014-11-14 (×3): 1 mg via INTRAVENOUS
  Filled 2014-11-13 (×3): qty 1

## 2014-11-13 MED ORDER — MORPHINE SULFATE (PF) 2 MG/ML IV SOLN
1.0000 mg | INTRAVENOUS | Status: DC
  Administered 2014-11-13 – 2014-11-14 (×6): 1 mg via INTRAVENOUS
  Filled 2014-11-13 (×6): qty 1

## 2014-11-13 MED ORDER — DEXTROSE 5 % IV SOLN
500.0000 mg | Freq: Two times a day (BID) | INTRAVENOUS | Status: DC
Start: 2014-11-13 — End: 2014-11-14
  Administered 2014-11-13 – 2014-11-14 (×2): 500 mg via INTRAVENOUS
  Filled 2014-11-13 (×6): qty 5

## 2014-11-13 MED ORDER — MORPHINE SULFATE (PF) 2 MG/ML IV SOLN
1.0000 mg | INTRAVENOUS | Status: DC | PRN
  Administered 2014-11-13 – 2014-11-14 (×2): 1 mg via INTRAVENOUS
  Filled 2014-11-13 (×2): qty 1

## 2014-11-13 NOTE — Consult Note (Signed)
Consultation Note Date: 11/13/2014   Patient Name: Melissa Pollard  DOB: 01/07/1940  MRN: [id number]  Age / Sex: 74 y.o., female  PCP: Herschel Senegal, MD Referring Physician: Alison Murray, MD  Reason for Consultation: Establishing goals of care, Non pain symptom management, Pain control and Psychosocial/spiritual support    Clinical Assessment/Narrative:  This NP Lorinda Creed reviewed medical records, received report from team, assessed the patient and then meet at the patient's bedside along with her husband and daughter to discuss diagnosis, prognosis, GOC, EOL wishes disposition and options.   74 y.o. female with past medical history of hypertension, parkinson's disease and related dementia, anxiety and depression who presented to [facility] ED status post fall .   In ED, right knee immobilizer placed. Pt was hemodynamically stable. Blood work showed WBC count 11, hemoglobin 10.8, potassium 5.2, Cr 1.33. X ray of the right knee showed no acute fracture which was confirmed with CT scan. It did however show large suprapatellar knee joint effusion. Hip x ray also showed right pubic ramus fracture. CXR showed no acute findings. She was admitted for further evaluation and management of her knee pain and knee effusion.  Family reports continued slow physical, functional and cognitive decline over the past many months. They speak to the patients personal wishes for no life prolonging measures in a terminal condition.  They hope for comfort, quality and dignity    A detailed discussion was had today regarding advanced directives.  Concepts specific to code status, artifical feeding and hydration, continued IV antibiotics and rehospitalization was had.  The difference between a aggressive medical intervention path  and a palliative comfort care path for this patient at this time was had.  Values and goals of care important to patient and  family were attempted to be elicited.  Concept of Hospice and Palliative Care were discussed  Natural trajectory and expectations at EOL were discussed.  Questions and concerns addressed. Family encouraged to call with questions or concerns.  PMT will continue to support holistically.   SUMMARY OF RECOMMENDATIONS  - shift to a full comfort path with no further life prolonging measures -symptom management -evaluate in 24-48 hours for in patient hospice facility    Code Status/Advance Care Planning:  DNR- documented today     Code Status Orders        Start     Ordered   11/13/14 1339  Do not attempt resuscitation (DNR)   Continuous    Question Answer Comment  In the event of cardiac or respiratory ARREST Do not call a "code blue"   In the event of cardiac or respiratory ARREST Do not perform Intubation, CPR, defibrillation or ACLS   In the event of cardiac or respiratory ARREST Use medication by any route, position, wound care, and other measures to relive pain and suffering. May use oxygen, suction and manual treatment of airway obstruction as needed for comfort.      11/13/14 1339      Other Directives:  MOST form introduced  Symptom Management:   Pain: Morphine 1 mg IV every 4 hrs scheduled                       Morphine 1 mg IV prn every 3 hrs prn                       Robaxin 500 mg IV bid  Place foley catheter for comfort  Agitation:  Seroquel 25 mg po bid  Palliative Prophylaxis:   Frequent Pain Assessment and Oral Care  Additional Recommendations (Limitations, Scope, Preferences):   Avoid Hospitalization, Full Comfort Care, No Artificial Feeding, No Blood Transfusions, No Chemotherapy, No Diagnostics, No Glucose Monitoring, No IV Antibiotics, No IV Fluids and No Lab Draws   Psycho-social/Spiritual:  Support System: Strong Desire for further Chaplaincy support:no Additional Recommendations: Education on Hospice and Grief/Bereavement  Support  Prognosis:  Re-assess in morning for more accurate prognosis  Discharge Planning:  Pending outcomes   Chief Complaint/ Primary Diagnoses: Present on Admission:  . Fall . Effusion of right knee . Benign essential HTN . Hyperkalemia . Leukocytosis . CKD (chronic kidney disease) stage 3, GFR 30-59 ml/min . Anemia of chronic kidney failure . Anxiety and depression . Parkinson disease (HCC) . Severe protein-calorie malnutrition (HCC) . Underweight  I have reviewed the medical record, interviewed the patient and family, and examined the patient. The following aspects are pertinent.  History reviewed. No pertinent past medical history. Social History   Social History  . Marital Status: Married    Spouse Name: Vilma Prader  . Number of Children: 2  . Years of Education: MA   Occupational History  . Retired    Social History Main Topics  . Smoking status: Former Smoker -- 0.20 packs/day for 25 years    Types: Cigarettes    Quit date: 01/06/1995  . Smokeless tobacco: Never Used  . Alcohol Use: No  . Drug Use: No  . Sexual Activity: Not Asked   Other Topics Concern  . 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 Stroke Father   . Hypertension Father   . Heart failure Father   . Asthma Father   . Diabetes Cousin   . Stroke Mother   . Hypertension Mother   . Breast cancer Mother 8   Scheduled Meds: . ALPRAZolam  0.25 mg Oral BID  . carbidopa-levodopa  1 tablet Oral QHS  . carbidopa-levodopa  1 tablet Oral 5 X Daily  . feeding supplement (ENSURE ENLIVE)  237 mL Oral TID AC & HS  . hydrALAZINE  10 mg Oral 3 times per day  . methocarbamol (ROBAXIN)  IV  500 mg Intravenous Q12H  .  morphine  injection  1 mg Intravenous Q4H  . QUEtiapine  25 mg Oral BID  . sertraline  75 mg Oral Daily   Continuous Infusions: . sodium chloride 10 mL/hr at 11/13/14 1439   PRN Meds:.hydrALAZINE, LORazepam, methocarbamol (ROBAXIN)  IV, morphine injection, ondansetron **OR** ondansetron (ZOFRAN) IV, oxyCODONE Medications Prior to Admission:  Prior to Admission medications   Medication Sig Start Date End Date Taking? Authorizing Provider  acetaminophen (TYLENOL) 500 MG tablet Take 500 mg by mouth 4 (four) times daily. 0600, 1000, 1400, 1800   Yes Historical Provider, MD  ALPRAZolam (XANAX) 0.25 MG tablet Take by mouth 2 (two) times daily.  10/04/14  Yes Historical Provider, MD  carbidopa-levodopa (SINEMET CR) 50-200 MG tablet Take 1 tablet by mouth at bedtime. 10/19/14  Yes Rebecca S Tat, DO  carbidopa-levodopa (SINEMET IR) 25-250 MG tablet Take 1  tablet by mouth 5 (five) times daily. 0600, 0900, 1200, 1500, 1800 10/07/14  Yes Historical Provider, MD  celecoxib (CELEBREX) 100 MG capsule Take 100 mg by mouth 2 (two) times daily.   Yes Historical Provider, MD  feeding supplement (ENSURE COMPLETE) LIQD Take 237 mLs by mouth 2 (two) times daily between meals. Patient taking differently: Take 237 mLs by mouth 4 (four) times daily -  before meals and at bedtime.  05/27/12  Yes Pasty Spillers McLean-Scocozza, MD  Melatonin 3 MG CAPS Take by mouth at bedtime as needed and may repeat dose one time if needed.   Yes Historical Provider, MD  mirtazapine (REMERON) 15 MG tablet Take 15 mg by mouth at bedtime.  10/07/14  Yes Historical Provider, MD  Probiotic Product (ULTRAFLORA IMMUNE HEALTH PO) Take 1 tablet by mouth daily.   Yes Historical Provider, MD  sertraline (ZOLOFT) 50 MG tablet Take 75 mg by mouth daily.  02/02/11  Yes Pecola Lawless, MD   No Known Allergies  Review of Systems  Unable to perform ROS   Physical Exam  Constitutional: She appears well-developed.  HENT:  Head: Normocephalic.  Cardiovascular:  Normal rate and regular rhythm.   Respiratory: Effort normal and breath sounds normal.  GI: Soft. Bowel sounds are normal.  Musculoskeletal:       Right knee: She exhibits swelling. Tenderness found.  Psychiatric: She is agitated, hyperactive and combative. She is noncommunicative.    Vital Signs: BP 153/51 mmHg  Pulse 84  Temp(Src) 98.1 F (36.7 C) (Axillary)  Resp 20  Ht 5\' 5"  (1.651 m)  Wt 51.619 kg (113 lb 12.8 oz)  BMI 18.94 kg/m2  SpO2 100%  SpO2: SpO2: 100 % O2 Device:SpO2: 100 % O2 Flow Rate: .   IO: Intake/output summary:  Intake/Output Summary (Last 24 hours) at 11/13/14 1514 Last data filed at 11/13/14 0700  Gross per 24 hour  Intake 3313.75 ml  Output      0 ml  Net 3313.75 ml    LBM: Last BM Date:  (pta) Baseline Weight: Weight: 48.898 kg (107 lb 12.8 oz) Most recent weight: Weight: 51.619 kg (113 lb 12.8 oz)      Palliative Assessment/Data:  Flowsheet Rows        Most Recent Value   Intake Tab    Referral Department  Hospitalist   Unit at Time of Referral  Oncology Unit   Palliative Care Primary Diagnosis  Other (Comment)   Date Notified  11/13/14   Palliative Care Type  New Palliative care   Reason for referral  Clarify Goals of Care   Date of Admission  11/11/14   # of days IP prior to Palliative referral  2   Clinical Assessment    Psychosocial & Spiritual Assessment    Palliative Care Outcomes       Additional Data Reviewed:  CBC:    Component Value Date/Time   WBC 8.8 11/12/2014 0540   HGB 10.1* 11/12/2014 0540   HCT 32.2* 11/12/2014 0540   PLT 227 11/12/2014 0540   MCV 100.3* 11/12/2014 0540   NEUTROABS 7.1 11/11/2014 1056   LYMPHSABS 2.5 11/11/2014 1056   MONOABS 1.1* 11/11/2014 1056   EOSABS 0.3 11/11/2014 1056   BASOSABS 0.0 11/11/2014 1056   Comprehensive Metabolic Panel:    Component Value Date/Time   NA 141 11/12/2014 0540   K 4.4 11/12/2014 0540   CL 111 11/12/2014 0540   CO2 25 11/12/2014 0540   BUN 28* 11/12/2014  0540   CREATININE 0.99 11/12/2014 0540   GLUCOSE 99 11/12/2014 0540   CALCIUM 8.7* 11/12/2014 0540   AST 23 11/11/2014 1056   ALT 6* 11/11/2014 1056   ALKPHOS 120 11/11/2014 1056   BILITOT 1.2 11/11/2014 1056   PROT 6.7 11/11/2014 1056   ALBUMIN 4.1 11/11/2014 1056     Time In: 1400 Time Out: 1530 Time Total: 90 minutes Greater than 50%  of this time was spent counseling and coordinating care related to the above assessment and plan.  Paged Dr Elisabeth Pigeonevine at [phone]- await callback  Discussed with Loletta SpecterSuzanna Kidd LCSW  Signed by: Lorinda CreedLARACH, Kymber Kosar, NP  Canary BrimMary W Jamaury Gumz, NP  11/13/2014, 3:14 PM   Please contact Palliative Medicine Team phone at [phone] for questions and concerns.

## 2014-11-13 NOTE — [place] FL2 (Signed)
[place] MEDICAID FL2 LEVEL OF CARE SCREENING TOOL     IDENTIFICATION  Patient Name: Melissa OddiBetsy Pollard Birthdate: 11/25/1940 Sex: female Admission Date (Current Location): 11/11/2014  [facility] [place]   Facility and Address:  [facility],  [address], [place] [id number]      Provider Number: [id number]  Attending Physician Name and Address:  Alison MurrayAlma M Devine, MD  Relative Name and Phone Number:       Current Level of Care: Hospital Recommended Level of Care: Skilled Nursing Facility Prior Approval Number:    Date Approved/Denied:   PASRR Number: [id number]  Discharge Plan: SNF    Current Diagnoses: Patient Active Problem List   Diagnosis Date Noted  . Fall 11/11/2014  . Effusion of right knee 11/11/2014  . Benign essential HTN 11/11/2014  . Hyperkalemia 11/11/2014  . Leukocytosis 11/11/2014  . CKD (chronic kidney disease) stage 3, GFR 30-59 ml/min 11/11/2014  . Anemia of chronic kidney failure 11/11/2014  . Anxiety and depression 11/11/2014  . Parkinson disease (HCC) 11/11/2014  . Severe protein-calorie malnutrition (HCC) 11/11/2014  . Underweight 11/11/2014    Orientation ACTIVITIES/SOCIAL BLADDER RESPIRATION    Self    Incontinent Normal  BEHAVIORAL SYMPTOMS/MOOD NEUROLOGICAL BOWEL NUTRITION STATUS      Continent Diet (Regular Diet)  PHYSICIAN VISITS COMMUNICATION OF NEEDS Height & Weight Skin    Verbally 5\' 5"  (165.1 cm) 107 lbs. Normal          AMBULATORY STATUS RESPIRATION    Assist extensive Normal      Personal Care Assistance Level of Assistance  Bathing, Dressing, Feeding Bathing Assistance: Maximum assistance Feeding assistance: Maximum assistance Dressing Assistance: Maximum assistance      Functional Limitations Info  Speech     Speech Info: Impaired       SPECIAL CARE FACTORS FREQUENCY  PT (By licensed PT), OT (By licensed OT)     PT Frequency: 5 x week OT Frequency: 5 x week            Additional Factors Info  Code Status, Allergies, Psychotropic Code Status Info: FULL code status Allergies Info: No Known Allergies           Current Medications (11/13/2014): Current Facility-Administered Medications  Medication Dose Route Frequency Provider Last Rate Last Dose  . 0.9 %  sodium chloride infusion   Intravenous Continuous Alison MurrayAlma M Devine, MD 75 mL/hr at 11/13/14 0628 1,000 mL at 11/13/14 0628  . acetaminophen (TYLENOL) tablet 500 mg  500 mg Oral QID Alison MurrayAlma M Devine, MD   500 mg at 11/13/14 1004  . acidophilus (RISAQUAD) capsule 1 capsule  1 capsule Oral Daily Alison MurrayAlma M Devine, MD   1 capsule at 11/13/14 1003  . ALPRAZolam Prudy Feeler(XANAX) tablet 0.25 mg  0.25 mg Oral BID Alison MurrayAlma M Devine, MD   0.25 mg at 11/13/14 1003  . carbidopa-levodopa (SINEMET CR) 50-200 MG per tablet controlled release 1 tablet  1 tablet Oral QHS Alison MurrayAlma M Devine, MD   1 tablet at 11/12/14 2023  . carbidopa-levodopa (SINEMET IR) 25-250 MG per tablet immediate release 1 tablet  1 tablet Oral 5 X Daily Alison MurrayAlma M Devine, MD   1 tablet at 11/13/14 1004  . feeding supplement (ENSURE ENLIVE) (ENSURE ENLIVE) liquid 237 mL  237 mL Oral TID AC & HS Alison MurrayAlma M Devine, MD   237 mL at 11/13/14 1004  . fentaNYL (SUBLIMAZE) injection 50 mcg  50 mcg Intravenous Q2H PRN Mallie DartingLaura A Harduk, PA-C  50 mcg at 11/13/14 0855  . hydrALAZINE (APRESOLINE) injection 5 mg  5 mg Intravenous Q6H PRN Alison Murray, MD      . hydrALAZINE (APRESOLINE) tablet 10 mg  10 mg Oral 3 times per day Alison Murray, MD   10 mg at 11/13/14 0626  . LORazepam (ATIVAN) injection 0.5 mg  0.5 mg Intravenous Q4H PRN Gerome Apley Harduk, PA-C   0.5 mg at 11/13/14 0020  . methocarbamol (ROBAXIN) 500 mg in dextrose 5 % 50 mL IVPB  500 mg Intravenous Q8H PRN Gerome Apley Harduk, PA-C   500 mg at 11/12/14 2008  . ondansetron (ZOFRAN) tablet 4 mg  4 mg Oral Q6H PRN Alison Murray, MD       Or  . ondansetron [facility]) injection 4 mg  4 mg Intravenous Q6H PRN Alison Murray, MD      . oxyCODONE (Oxy  IR/ROXICODONE) immediate release tablet 5 mg  5 mg Oral Q4H PRN Gerome Apley Harduk, PA-C      . sertraline (ZOLOFT) tablet 75 mg  75 mg Oral Daily Alison Murray, MD   75 mg at 11/13/14 1003   Do not use this list as official medication orders. Please verify with discharge summary.  Discharge Medications:   Medication List    ASK your doctor about these medications        acetaminophen 500 MG tablet  Commonly known as:  TYLENOL  Take 500 mg by mouth 4 (four) times daily. 0600, 1000, 1400, 1800     ALPRAZolam 0.25 MG tablet  Commonly known as:  XANAX  Take by mouth 2 (two) times daily.     carbidopa-levodopa 25-250 MG tablet  Commonly known as:  SINEMET IR  Take 1 tablet by mouth 5 (five) times daily. 0600, 0900, 1200, 1500, 1800     carbidopa-levodopa 50-200 MG tablet  Commonly known as:  SINEMET CR  Take 1 tablet by mouth at bedtime.     celecoxib 100 MG capsule  Commonly known as:  CELEBREX  Take 100 mg by mouth 2 (two) times daily.     feeding supplement (ENSURE COMPLETE) Liqd  Take 237 mLs by mouth 2 (two) times daily between meals.     Melatonin 3 MG Caps  Take by mouth at bedtime as needed and may repeat dose one time if needed.     mirtazapine 15 MG tablet  Commonly known as:  REMERON  Take 15 mg by mouth at bedtime.     sertraline 50 MG tablet  Commonly known as:  ZOLOFT  Take 75 mg by mouth daily.     ULTRAFLORA IMMUNE HEALTH PO  Take 1 tablet by mouth daily.        Relevant Imaging Results:  Relevant Lab Results:  Recent Labs    Additional Information SSN: [id number]  KIDD, SUZANNA A, LCSW

## 2014-11-13 NOTE — Progress Notes (Signed)
Patient ID: Melissa PotterBetsy Pollard, female   DOB: 04/24/1940, 74 y.o.   MRN: [id number] [facility] PROGRESS NOTE  Melissa PotterBetsy Pollard [id number] DOB: 02/27/1940 DOA: 11/11/2014 PCP: Herschel SenegalHABER, MICHELE, MD  Brief narrative:    74 y.o. female with past medical history of hypertension, parkinson's disease and related dementia, anxiety and depression who [facility] with reports of worsening right knee pain and swelling over last few days. Patient fell a few days ago and again 1 day prior to this admission.  In ED, right knee immobilizer placed. Blood work showed WBC count 11, hemoglobin 10.8, potassium 5.2, Cr 1.33. X ray of the right knee showed no acute fracture which was confirmed with CT scan. It did however show large suprapatellar knee joint effusion. Hip x ray also showed right pubic ramus fracture. CXR showed no acute findings. She was admitted for further evaluation and management of her knee pain and knee effusion.  Anticipated discharge: needs arthrocentesis today.  Assessment/Plan:    Principal Problem:  Effusion of right knee / Fall - X-ray and CT scan showed large suprapatellar knee joint effusion the right knee. Likely due to recent fall. - Plan for arthrocentesis today. Appreciate IR help with this. - No evidence of acute infection on plan films and on CT scan. - Ortho recommend WBAT, surgical intervention not warranted  - Continue pain management efforts  Active Problems:  Benign essential HTN - BP 153/51 - Continue hydralazine 10 mg every 8 hours    Hyperkalemia - Unclear etiology - Potassium has spontaneously normalized.   Leukocytosis - Likely reactive - No evidence of acute infection on x-ray or CT scan - Spontaneously resolved.    CKD (chronic kidney disease) stage 3, GFR 30-59 ml/min - Creatinine 1.3 on admission and has improved to WNL with IV fluids   Anemia of chronic kidney failure - Hemoglobin stable at 10.1 - No current indications for transfusion     Anxiety and depression - Continue xanax and Zoloft  - Stable   Parkinson disease (HCC) - Continue sinemet - PT evaluation - SNF recommended - Appreciate SW assisting D/C planning    Severe protein-calorie malnutrition (HCC) / Underweight - Body mass index is 17.94 kg/(m^2).  - Seen by dietician  - Continue nutritional supplementation    DVT Prophylaxis  - SCD's bilaterally in hospital   Code Status: Full.  Family Communication:  plan of care discussed with the patient Disposition Plan: arthrocentesis today. D/C possibly 11/9  IV access:  Peripheral IV  Procedures and diagnostic studies:    Dg Chest 1 View 11/11/2014 No acute cardiopulmonary findings and no definite acute fractures.  Ct Knee Right Wo Contrast 11/11/2014 1. Longitudinally split patella appears to be a remote postsurgical finding and not an acute fracture. 2. Large suprapatellar knee joint effusion and small loose bodies. 3. Tricompartmental degenerative changes. No acute fracture.   Dg Knee Complete 4 Views Right 11/11/2014 Longitudinal fracture through the patella with distraction of the fragments. 2. Moderate-sized effusion. 3. Mild degenerative changes.  Dg Hip Unilat With Pelvis 2-3 Views Right 11/11/2014 1. Limited study demonstrating what appears to be a parasymphyseal fracture of the right pubic bone.   Medical Consultants:  Ortho IR  Other Consultants:  PT Nutrition  IAnti-Infectives:   None    Manson PasseyEVINE, Michell Giuliano, MD  [facility] Pager [phone]  Time spent in minutes: 25 minutes  If 7PM-7AM, please contact night-coverage [email or web address] Password [email or web address] 11/13/2014, 5:49 AM      HPI/Subjective: Patient found sitting  on floor last night. Stable this am.  Objective: Filed Vitals:   11/12/14 1257 11/12/14 2005 11/12/14 2235 11/13/14 0531  BP: 170/96  107/73 153/51  Pulse: 68 78 104 84  Temp: 97.5 F (36.4 C)   98.1 F (36.7 C)  TempSrc: Axillary   Axillary  Resp: 20   20   Height:      Weight:      SpO2: 100%   100%    Intake/Output Summary (Last 24 hours) at 11/13/14 0549 Last data filed at 11/12/14 2220  Gross per 24 hour  Intake   2435 ml  Output      0 ml  Net   2435 ml    Exam:   General:  Pt is  not in acute distress  Cardiovascular: Rate controlled, appreciate S1, S2   Respiratory: No wheezing, no crackles, no rhonchi  Abdomen: (+) BS, non tender   Extremities: palpable pulses, right knee swollen with tenderness to palpation  Neuro: Nonfocal  Data Reviewed: Basic Metabolic Panel:  Recent Labs Lab 11/11/14 1056 11/11/14 1808 11/12/14 0540  NA 140 138 141  K 5.2* 4.0 4.4  CL 107 109 111  CO2 GLUCOSE 113* 109* 99  BUN 33* 28* 28*  CREATININE 1.33* 1.20* 0.99  CALCIUM 9.3 8.8* 8.7*   Liver Function Tests:  Recent Labs Lab 11/11/14 1056  AST 23  ALT 6*  ALKPHOS 120  BILITOT 1.2  PROT 6.7  ALBUMIN 4.1   No results for input(s): LIPASE, AMYLASE in the last 168 hours. No results for input(s): AMMONIA in the last 168 hours. CBC:  Recent Labs Lab 11/11/14 1056 11/12/14 0540  WBC 11.0* 8.8  NEUTROABS 7.1  --   HGB 10.8* 10.1*  HCT 33.2* 32.2*  MCV 96.8 100.3*  PLT 283 227   Cardiac Enzymes: No results for input(s): CKTOTAL, CKMB, CKMBINDEX, TROPONINI in the last 168 hours. BNP: Invalid input(s): POCBNP CBG:  Recent Labs Lab 11/12/14 0805  GLUCAP 80    No results found for this or any previous visit (from the past 240 hour(s)).   Scheduled Meds: . acetaminophen  500 mg Oral QID  . acidophilus  1 capsule Oral Daily  . ALPRAZolam  0.25 mg Oral BID  . carbidopa-levodopa  1 tablet Oral QHS  . carbidopa-levodopa  1 tablet Oral 5 X Daily  . feeding supplement (ENSURE ENLIVE)  237 mL Oral TID AC & HS  . hydrALAZINE  10 mg Oral 3 times per day  . sertraline  75 mg Oral Daily   Continuous Infusions: . sodium chloride 75 mL/hr at 11/11/14 1532

## 2014-11-14 DIAGNOSIS — F418 Other specified anxiety disorders: Secondary | ICD-10-CM | POA: Diagnosis not present

## 2014-11-14 DIAGNOSIS — N183 Chronic kidney disease, stage 3 (moderate): Secondary | ICD-10-CM | POA: Diagnosis not present

## 2014-11-14 DIAGNOSIS — R627 Adult failure to thrive: Secondary | ICD-10-CM | POA: Diagnosis not present

## 2014-11-14 DIAGNOSIS — M25461 Effusion, right knee: Secondary | ICD-10-CM | POA: Diagnosis not present

## 2014-11-14 DIAGNOSIS — I10 Essential (primary) hypertension: Secondary | ICD-10-CM | POA: Diagnosis not present

## 2014-11-14 DIAGNOSIS — Z515 Encounter for palliative care: Secondary | ICD-10-CM | POA: Diagnosis not present

## 2014-11-14 DIAGNOSIS — R451 Restlessness and agitation: Secondary | ICD-10-CM | POA: Diagnosis not present

## 2014-11-14 MED ORDER — MORPHINE SULFATE 20 MG/5ML PO SOLN: 2.5000 mg | ORAL | Status: AC | PRN

## 2014-11-14 MED ORDER — ALPRAZOLAM 0.25 MG PO TABS: 0.2500 mg | ORAL_TABLET | Freq: Two times a day (BID) | ORAL | Status: AC

## 2014-11-14 MED ORDER — HYDRALAZINE HCL 10 MG PO TABS: 10.0000 mg | ORAL_TABLET | Freq: Three times a day (TID) | ORAL | Status: AC

## 2014-11-14 NOTE — Discharge Summary (Signed)
Physician Discharge Summary  Melissa PotterBetsy Pollard [id number] DOB: 02/18/1940 DOA: 11/11/2014  PCP: Herschel SenegalHABER, MICHELE, MD  Admit date: 11/11/2014 Discharge date: 11/14/2014  Recommendations for Outpatient Follow-up:  1. Continue pain management efforts  Discharge Diagnoses:  Principal Problem:   Effusion of right knee Active Problems:   Fall   Benign essential HTN   Hyperkalemia   Leukocytosis   CKD (chronic kidney disease) stage 3, GFR 30-59 ml/min   Anemia of chronic kidney failure   Anxiety and depression   Parkinson disease (HCC)   Severe protein-calorie malnutrition (HCC)   Underweight   Palliative care encounter   DNR (do not resuscitate)   Agitation    Discharge Condition: stable   Diet recommendation: as tolerated   History of present illness:  74 y.o. female with past medical history of hypertension, parkinson's disease and related dementia, anxiety and depression who [facility] with reports of worsening right knee pain and swelling over last few days. Patient fell a few days ago and again 1 day prior to this admission.  In ED, right knee immobilizer placed. Blood work showed WBC count 11, hemoglobin 10.8, potassium 5.2, Cr 1.33. X ray of the right knee showed no acute fracture which was confirmed with CT scan. It did however show large suprapatellar knee joint effusion. Hip x ray also showed right pubic ramus fracture. CXR showed no acute findings. She was admitted for further evaluation and management of her knee pain and knee effusion. Family decided to mover forward with full comfort care. Arthrocentesis not done for that reason.   Hospital Course:   Assessment/Plan:    Principal Problem:  Effusion of right knee / Fall - X-ray and CT scan showed large suprapatellar knee joint effusion the right knee. Likely due to recent fall. - No evidence of acute infection on plan films and on CT scan. - Per ortho recommendation,  WBAT, surgical intervention not  warranted  - Continue pain management efforts - Family has decided  Active Problems:  Benign essential HTN - Continue hydralazine 10 mg every 8 hours on discharge    Hyperkalemia - Unclear etiology - Potassium has spontaneously normalized.   Leukocytosis - Likely reactive due to fall - No evidence of acute infection on x-ray or CT scan - WBC count normalized   CKD (chronic kidney disease) stage 3, GFR 30-59 ml/min - Creatinine 1.3 on admission and has improved to WNL with IV fluids   Anemia of chronic kidney failure - Hemoglobin stable at 10.1   Anxiety and depression - Continue xanax and Zoloft    Parkinson disease (HCC) - Continue sinemet - PT evaluation completed and recommendation is for SNF placement - Palliative consulted for goals of care, family and pt want to proceed with full comofrt    Severe protein-calorie malnutrition (HCC) / Underweight - Body mass index is 17.94 kg/(m^2).  - Continue nutritional supplementation as recommended by dietician    DVT Prophylaxis  - SCD's bilaterally in hospital   Code Status: Full.  Family Communication: family ot at the bedside this am   IV access:  Peripheral IV  Procedures and diagnostic studies:   Dg Chest 1 View 11/11/2014 No acute cardiopulmonary findings and no definite acute fractures.  Ct Knee Right Wo Contrast 11/11/2014 1. Longitudinally split patella appears to be a remote postsurgical finding and not an acute fracture. 2. Large suprapatellar knee joint effusion and small loose bodies. 3. Tricompartmental degenerative changes. No acute fracture.   Dg Knee Complete 4  Views Right 11/11/2014 Longitudinal fracture through the patella with distraction of the fragments. 2. Moderate-sized effusion. 3. Mild degenerative changes.  Dg Hip Unilat With Pelvis 2-3 Views Right 11/11/2014 1. Limited study demonstrating what appears to be a parasymphyseal fracture of the right pubic bone.   Medical  Consultants:  Ortho IR Other Consultants:  PT Nutrition  IAnti-Infectives:   None   Signed:  Manson Passey, MD  [facility] 11/14/2014, 6:05 AM  Pager #: [phone]  Time spent in minutes: more than 30 minutes   Discharge Exam: Filed Vitals:   11/13/14 2225  BP: 152/82  Pulse: 71  Temp: 97.4 F (36.3 C)  Resp: 20   Filed Vitals:   11/13/14 0500 11/13/14 0531 11/13/14 1450 11/13/14 2225  BP:  153/51 158/60 152/82  Pulse:  84 82 71  Temp:  98.1 F (36.7 C) 98.2 F (36.8 C) 97.4 F (36.3 C)  TempSrc:  Axillary Axillary Axillary  Resp:  Height:      Weight: 51.619 kg (113 lb 12.8 oz)     SpO2:  100% 98% 98%    General: Pt is alert, , not in acute distress Cardiovascular: Regular rate and rhythm, S1/S2 + Respiratory: Clear to auscultation bilaterally, no wheezing, no crackles, no rhonchi Abdominal: Soft, non tender, non distended, bowel sounds +, no guarding Extremities: right knee swelling, no LE edema, right knee immobilizer placed  Neuro: Grossly nonfocal  Discharge Instructions  Discharge Instructions    Call MD for:  difficulty breathing, headache or visual disturbances    Complete by:  As directed      Call MD for:  persistant dizziness or light-headedness    Complete by:  As directed      Call MD for:  persistant nausea and vomiting    Complete by:  As directed      Call MD for:  severe uncontrolled pain    Complete by:  As directed      Diet - low sodium heart healthy    Complete by:  As directed      Increase activity slowly    Complete by:  As directed             Medication List    TAKE these medications        acetaminophen 500 MG tablet  Commonly known as:  TYLENOL  Take 500 mg by mouth 4 (four) times daily. 0600, 1000, 1400, 1800     ALPRAZolam 0.25 MG tablet  Commonly known as:  XANAX  Take 1 tablet (0.25 mg total) by mouth 2 (two) times daily.     carbidopa-levodopa 25-250 MG tablet  Commonly known  as:  SINEMET IR  Take 1 tablet by mouth 5 (five) times daily. 0600, 0900, 1200, 1500, 1800     carbidopa-levodopa 50-200 MG tablet  Commonly known as:  SINEMET CR  Take 1 tablet by mouth at bedtime.     celecoxib 100 MG capsule  Commonly known as:  CELEBREX  Take 100 mg by mouth 2 (two) times daily.     feeding supplement (ENSURE COMPLETE) Liqd  Take 237 mLs by mouth 2 (two) times daily between meals.     hydrALAZINE 10 MG tablet  Commonly known as:  APRESOLINE  Take 1 tablet (10 mg total) by mouth every 8 (eight) hours.     Melatonin 3 MG Caps  Take by mouth at bedtime as needed and may repeat dose one time if needed.  mirtazapine 15 MG tablet  Commonly known as:  REMERON  Take 15 mg by mouth at bedtime.     morphine 20 MG/5ML solution  Take 0.6 mLs (2.4 mg total) by mouth every 2 (two) hours as needed for pain.     sertraline 50 MG tablet  Commonly known as:  ZOLOFT  Take 75 mg by mouth daily.     ULTRAFLORA IMMUNE HEALTH PO  Take 1 tablet by mouth daily.           Follow-up Information    Follow up with Herschel Senegal, MD. Schedule an appointment as soon as possible for a visit in 1 week.   Specialties:  Internal Medicine, Geriatric Medicine   Why:  Follow up appt after recent hospitalization   Contact information:   [address] [place] [address] [phone]        The results of significant diagnostics from this hospitalization (including imaging, microbiology, ancillary and laboratory) are listed below for reference.    Significant Diagnostic Studies: Dg Chest 1 View  11/11/2014  CLINICAL DATA:  Larey Seat today.  Right-sided pain. EXAM: CHEST 1 VIEW COMPARISON:  05/27/2012 FINDINGS: The heart is borderline enlarged but stable. There is moderate tortuosity and ectasia of the thoracic aorta which is stable. The lungs are clear. No pleural effusion. The bony thorax is intact. IMPRESSION: No acute cardiopulmonary findings and no definite acute fractures.  Electronically Signed   By: Rudie Meyer M.D.   On: 11/11/2014 11:47   Ct Knee Right Wo Contrast  11/11/2014  CLINICAL DATA:  Larey Seat today.  Knee pain and swelling. EXAM: CT OF THE right KNEE WITHOUT CONTRAST TECHNIQUE: Multidetector CT imaging of the right knee was performed according to the standard protocol. Multiplanar CT image reconstructions were also generated. COMPARISON:  Radiographs 11/11/2014 FINDINGS: The patella is split into 2 parts longitudinally. However, this does not have the appearance of an acute fracture. It is much more likely some type of prior surgical procedure with a sharp, corticated smooth margin. There also appears to be a defect in the quadriceps tendon. The patella tendon appears to be intact to both halves of the patella. Advanced tricompartmental degenerative changes with joint space narrowing, osteophytic spurring and subchondral cystic change. Very large joint effusion with small loose bodies. No fracture of the tibia, femur or fibula is identified. Chondrocalcinosis is noted. IMPRESSION: 1. Longitudinally split patella appears to be a remote postsurgical finding and not an acute fracture. 2. Large suprapatellar knee joint effusion and small loose bodies. 3. Tricompartmental degenerative changes.  No acute fracture. Electronically Signed   By: Rudie Meyer M.D.   On: 11/11/2014 14:23   Dg Knee Complete 4 Views Right  11/11/2014  CLINICAL DATA:  Right knee swelling for the past 2 days. Worse after falling this morning. EXAM: RIGHT KNEE - COMPLETE 4+ VIEW COMPARISON:  None. FINDINGS: Diffuse anterior soft tissue swelling. Lodged signal fracture through the patella with distraction of the fragments. This is mildly comminuted and some of the margins are poorly defined. There is an associated moderate-sized effusion. Also noted is mild tricompartmental spur formation. IMPRESSION: 1. Longitudinal fracture through the patella with distraction of the fragments. 2. Moderate-sized  effusion. 3. Mild degenerative changes. Electronically Signed   By: Beckie Salts M.D.   On: 11/11/2014 11:41   Dg Hip Unilat With Pelvis 2-3 Views Right  11/11/2014  CLINICAL DATA:  74 year old female with a history a trauma from a fall landing onto her right side complaining  of right-sided hip pain. EXAM: DG HIP (WITH OR WITHOUT PELVIS) 2-3V RIGHT COMPARISON:  No priors. FINDINGS: Study is limited by atypical projections (patient is consistently rotated to the right). Despite this limitation, there is apparent cortical disruption in the parasymphyseal region of the right pubic bone. Other visualized portions of the pelvis appear intact. Right proximal femur appears intact, although images demonstrate essentially the same projection, which limits assessment. Right femoral head is properly located. IMPRESSION: 1. Limited study demonstrating what appears to be a parasymphyseal fracture of the right pubic bone. Electronically Signed   By: Trudie Reed M.D.   On: 11/11/2014 11:48    Microbiology: No results found for this or any previous visit (from the past 240 hour(s)).   Labs: Basic Metabolic Panel:  Recent Labs Lab 11/11/14 1056 11/11/14 1808 11/12/14 0540  NA 140 138 141  K 5.2* 4.0 4.4  CL 107 109 111  CO2 GLUCOSE 113* 109* 99  BUN 33* 28* 28*  CREATININE 1.33* 1.20* 0.99  CALCIUM 9.3 8.8* 8.7*   Liver Function Tests:  Recent Labs Lab 11/11/14 1056  AST 23  ALT 6*  ALKPHOS 120  BILITOT 1.2  PROT 6.7  ALBUMIN 4.1   No results for input(s): LIPASE, AMYLASE in the last 168 hours. No results for input(s): AMMONIA in the last 168 hours. CBC:  Recent Labs Lab 11/11/14 1056 11/12/14 0540  WBC 11.0* 8.8  NEUTROABS 7.1  --   HGB 10.8* 10.1*  HCT 33.2* 32.2*  MCV 96.8 100.3*  PLT 283 227   Cardiac Enzymes: No results for input(s): CKTOTAL, CKMB, CKMBINDEX, TROPONINI in the last 168 hours. BNP: BNP (last 3 results) No results for input(s): BNP in the last  8760 hours.  ProBNP (last 3 results) No results for input(s): PROBNP in the last 8760 hours.  CBG:  Recent Labs Lab 11/12/14 0805 11/13/14 0737  GLUCAP 80 87

## 2014-11-14 NOTE — Progress Notes (Signed)
CSW continuing to follow.   CSW followed up with palliative care nurse practitioner, Melissa Pollard regarding recommendations. Palliative care recommending residential hospice placement.  CSW spoke with pt husband via telephone this morning regarding disposition planning. CSW discussed with pt husband recommendation for residential hospice. Pt husband expressed understanding and discussed that he wants patient to be comfortable. CSW offered choice for residential hospice choice. Pt [facility] stated that [facility] [address] [facility] [address]   CS[facility] made referrals [facility] and [address]. Facilities reviewing information and will notify CSW once referral reviewed.  CSW to continue to follow.  Melissa SpecterSuzanna Pollard, MSW, LCSW Clinical Social Work [phone]

## 2014-11-14 NOTE — Discharge Instructions (Signed)
Knee Effusion °Knee effusion means that you have excess fluid in your knee joint. This can cause pain and swelling in your knee. This may make your knee more difficult to bend and move. That is because there is increased pain and pressure in the joint. If there is fluid in your knee, it often means that something is wrong inside your knee, such as severe arthritis, abnormal inflammation, or an infection. Another common cause of knee effusion is an injury to the knee muscles, ligaments, or cartilage. °HOME CARE INSTRUCTIONS °· Use crutches as directed by your health care provider. °· Wear a knee brace as directed by your health care provider. °· Apply ice to the swollen area: °¨ Put ice in a plastic bag. °¨ Place a towel between your skin and the bag. °¨ Leave the ice on for 20 minutes, 2-3 times per day. °· Keep your knee raised (elevated) when you are sitting or lying down. °· Take medicines only as directed by your health care provider. °· Do any rehabilitation or strengthening exercises as directed by your health care provider. °· Rest your knee as directed by your health care provider. You may start doing your normal activities again when your health care provider approves.    °· Keep all follow-up visits as directed by your health care provider. This is important. °SEEK MEDICAL CARE IF: °· You have ongoing (persistent) pain in your knee. °SEEK IMMEDIATE MEDICAL CARE IF: °· You have increased swelling or redness of your knee. °· You have severe pain in your knee. °· You have a fever. °  °This information is not intended to replace advice given to you by your health care provider. Make sure you discuss any questions you have with your health care provider. °  °Document Released: 03/14/2003 Document Revised: 01/12/2014 Document Reviewed: 08/07/2013 °Elsevier Interactive Patient Education ©2016 [employer] ° °

## 2014-11-14 NOTE — Progress Notes (Signed)
CSW continuing to follow.   CSW received notification from [facility] that facility reviewed pt information and evaluated pt at bedside and spoke with pt husband and [facility] can offer pt a bed for today.   CSW met with pt husband at bedside and pt husband is in agreement to transfer to [facility] today.   CSW facilitated pt discharge needs including contacting facility, getting discharge packet completed, providing RN phone number to call report, discussing with pt husband at bedside and providing emotional support surrounding pt transition to [facility], and arranging ambulance transport for pt to [facility].   No further social work needs identified at this time.  CSW signing off.   Melissa Pollard, MSW, [place] Work [phone]

## 2014-11-14 NOTE — Progress Notes (Signed)
Occupational Therapy Evaluation Addendum:    11/12/14 1700  OT G-codes **NOT FOR INPATIENT CLASS**  Functional Limitation Self care  Self Care Current Status ([id number](G8987) CN  Self Care Goal Status (U0454(G8988) CM  Jeani HawkingWendi Mariapaula Krist, OTR/L [phone]

## 2014-11-14 NOTE — Clinical Social Work Placement (Signed)
   CLINICAL SOCIAL WORK PLACEMENT  NOTE  Date:  11/14/2014  Patient Details  Name: Melissa Pollard MRN: [id number] Date of Birth: 12/10/1940  Clinical Social Work is seeking post-discharge placement for this patient at the Skilled  Nursing Facility level of care (*CSW will initial, date and re-position this form in  chart as items are completed):  Yes   Patient/family provided with [facility] Clinical Social Work Department's list of facilities offering this level of care within the geographic area requested by the patient (or if unable, by the patient's family).  Yes   Patient/family informed of their freedom to choose among providers that offer the needed level of care, that participate in Medicare, Medicaid or managed care program needed by the patient, have an available bed and are willing to accept the patient.  Yes   Patient/family informed of [facility]'s ownership interest in [facility] and [facility], as well as of the fact that they are under no obligation to receive care at these facilities.  PASRR submitted to EDS on 11/12/14     PASRR number received on       Existing PASRR number confirmed on       FL2 transmitted to all facilities in geographic area requested by pt/family on 11/12/14     FL2 transmitted to all facilities within larger geographic area on       Patient informed that his/her managed care company has contracts with or will negotiate with certain facilities, including the following:        Yes   Patient/family informed of bed offers received.  Patient chooses bed at Other - please specify in the comment section below: [facility]     Physician recommends and patient chooses bed at      Patient to be transferred to Other - please specify in the comment section below: [facility] on 11/14/14.  Patient to be transferred to facility by ambulance [facility])     Patient family notified on 11/14/14 of transfer.  Name  of family member notified:  pt husband, Mr. Nevares notified at bedside     PHYSICIAN Please sign FL2, Please sign DNR     Additional Comment:    _______________________________________________ Orson EvaKIDD, SUZANNA A, LCSW 11/14/2014, 2:41 PM

## 2014-11-14 NOTE — Progress Notes (Signed)
Clinical Social Work Note-Late Entry  CSW continuing to follow for disposition plan.  Pt remains with a safety sitter present and appears in discomfort.   CSW met with pt husband to discuss disposition planning. SNF bed offers provided. Pt husband chose [facility]. CSW provided support as pt husband discussed that he wanted to speak with palliative care. Pt husband stated that he had spoken to palliative care in the past, but pt husband expressed that he feels that pt is beginning to go down a path of decline. CSW notified pt husband that CSW can discuss with MD to order palliative medicine consult. Pt husband appreciative.   CSW notified MD and MD ordered palliative care consult.   CSW notified [facility].   CSW to follow up on palliative care recommendations.   CSW to continue to follow to provide support and assist with disposition needs.  Alison Murray, MSW, [place] Work [phone]

## 2014-11-14 NOTE — Progress Notes (Signed)
Daily Progress Note   Patient Name: Melissa Pollard       Date: 11/14/2014 DOB: 05/07/1940  Age: 74 y.o. MRN#: [id number] Attending Physician: Alison MurrayAlma M Devine, MD Primary Care Physician: Herschel SenegalHABER, MICHELE, MD Admit Date: 11/11/2014  Reason for Consultation/Follow-up: Establishing goals of care, Inpatient hospice referral, Non pain symptom management, Pain control, Psychosocial/spiritual support and Terminal Care  Subjective:     - continued conversation regarding Natural trajectory and expectations at EOL. Questions and concerns addressed.  -husband is comfortable and at peace with decision for full comfort.  Seeing the patient agitated, confused, "exposed" has been very difficult for family.  They know she would not want any life prolonging measures.  They are comfortable with the patient being more sleepy that awake as long as she is not thrashing around in the bed.  -family is hopeful for hospice facility  Family encouraged to call with questions or concerns. PMT will continue to support holistically.   Length of Stay: none  Current Medications: Scheduled Meds:  . carbidopa-levodopa  1 tablet Oral QHS  . carbidopa-levodopa  1 tablet Oral 5 X Daily  . feeding supplement (ENSURE ENLIVE)  237 mL Oral TID AC & HS  . hydrALAZINE  10 mg Oral 3 times per day  . methocarbamol (ROBAXIN)  IV  500 mg Intravenous Q12H  .  morphine injection  1 mg Intravenous Q4H  . QUEtiapine  25 mg Oral BID  . sertraline  75 mg Oral Daily    Continuous Infusions: . sodium chloride 10 mL/hr at 11/13/14 1439    PRN Meds: hydrALAZINE, LORazepam, morphine injection, ondansetron **OR** ondansetron (ZOFRAN) IV  Palliative Performance Scale: 20 %     Vital Signs: BP 145/74 mmHg  Pulse 52  Temp(Src) 97.3 F (36.3 C) (Axillary)  Resp 18  Ht 5\' 5"  (1.651 m)  Wt 51.619 kg (113 lb 12.8 oz)  BMI 18.94 kg/m2  SpO2 95% SpO2: SpO2: 95 % O2 Device: O2 Device: Not Delivered O2 Flow Rate:    Intake/output  summary:  Intake/Output Summary (Last 24 hours) at 11/14/14 1144 Last data filed at 11/14/14 0800  Gross per 24 hour  Intake      0 ml  Output    800 ml  Net   -800 ml   LBM: Last BM Date:  (pta) Baseline Weight: Weight: 48.898 kg (107 lb 12.8 oz) Most recent weight: Weight: 51.619 kg (113 lb 12.8 oz)  Physical Exam:  Constitutional: Ill appearing, lethargic HENT: dry buccal membranes Head: Normocephalic.  Cardiovascular: bradycardic Respiratory: Effort normal and breath sounds normal.  GI: Soft. Bowel sounds are normal.  Musculoskeletal:   Right knee: She exhibits swelling. Tenderness found.  Psychiatric:  She is noncommunicative.     Additional Data Reviewed: Recent Labs     11/11/14  1808  11/12/14  0540  WBC   --   8.8  HGB   --   10.1*  PLT   --   227  NA  138  141  BUN  28*  28*  CREATININE  1.20*  0.99     Problem List:  Patient Active Problem List   Diagnosis Date Noted  . Palliative care encounter 11/13/2014  . DNR (do not resuscitate) 11/13/2014  . Agitation 11/13/2014  . Fall 11/11/2014  . Effusion of right knee 11/11/2014  . Benign essential HTN 11/11/2014  . Hyperkalemia 11/11/2014  . Leukocytosis 11/11/2014  . CKD (chronic kidney disease) stage 3, GFR 30-59 ml/min 11/11/2014  .  Anemia of chronic kidney failure 11/11/2014  . Anxiety and depression 11/11/2014  . Parkinson disease (HCC) 11/11/2014  . Severe protein-calorie malnutrition (HCC) 11/11/2014  . Underweight 11/11/2014     Palliative Care Assessment & Plan    Code Status:  DNR  Goals of Care:  Comfort, quality and dignity   Symptom Management  Pain: Morphine 1 mg IV every 4 hrs scheduled  Morphine 1 mg IV prn every 3 hrs prn  Robaxin 500 mg IV bid Foley catheter for comfort Agitation: Seroquel 25 mg po bid     Prognosis: < 2 weeks  5. Discharge Planning: Hospice facility   Care plan was discussed with  [facility] B[facility]   Thank you for allowing the Palliative Medicine Team to assist in the care of this patient.   Time In: 1100 Time Out: 1145 Total Time 45 min Prolonged Time Billed  no     Greater than 50%  of this time was spent counseling and coordinating care related to the above assessment and plan.     Canary Brim, NP  11/14/2014, 11:44 AM  Please contact Palliative Medicine Team phone at [phone] for questions and concerns.

## 2014-11-15 DIAGNOSIS — R627 Adult failure to thrive: Secondary | ICD-10-CM | POA: Insufficient documentation

## 2014-12-04 ENCOUNTER — Ambulatory Visit: Payer: Medicare Other | Admitting: Neurology

## 2014-12-06 DEATH — deceased

## 2014-12-07 ENCOUNTER — Ambulatory Visit: Payer: Medicare Other | Admitting: Neurology

## 2016-06-15 IMAGING — CR DG HIP (WITH OR WITHOUT PELVIS) 2-3V*R*
3 series · 3 of 3 positions shown · non-contrast
Comparison: No priors.

CLINICAL DATA: 74-year-old female with a history a trauma from a
fall landing onto her right side complaining of right-sided hip
pain.

EXAM:
DG HIP (WITH OR WITHOUT PELVIS) 2-3V RIGHT

[x pelvis]
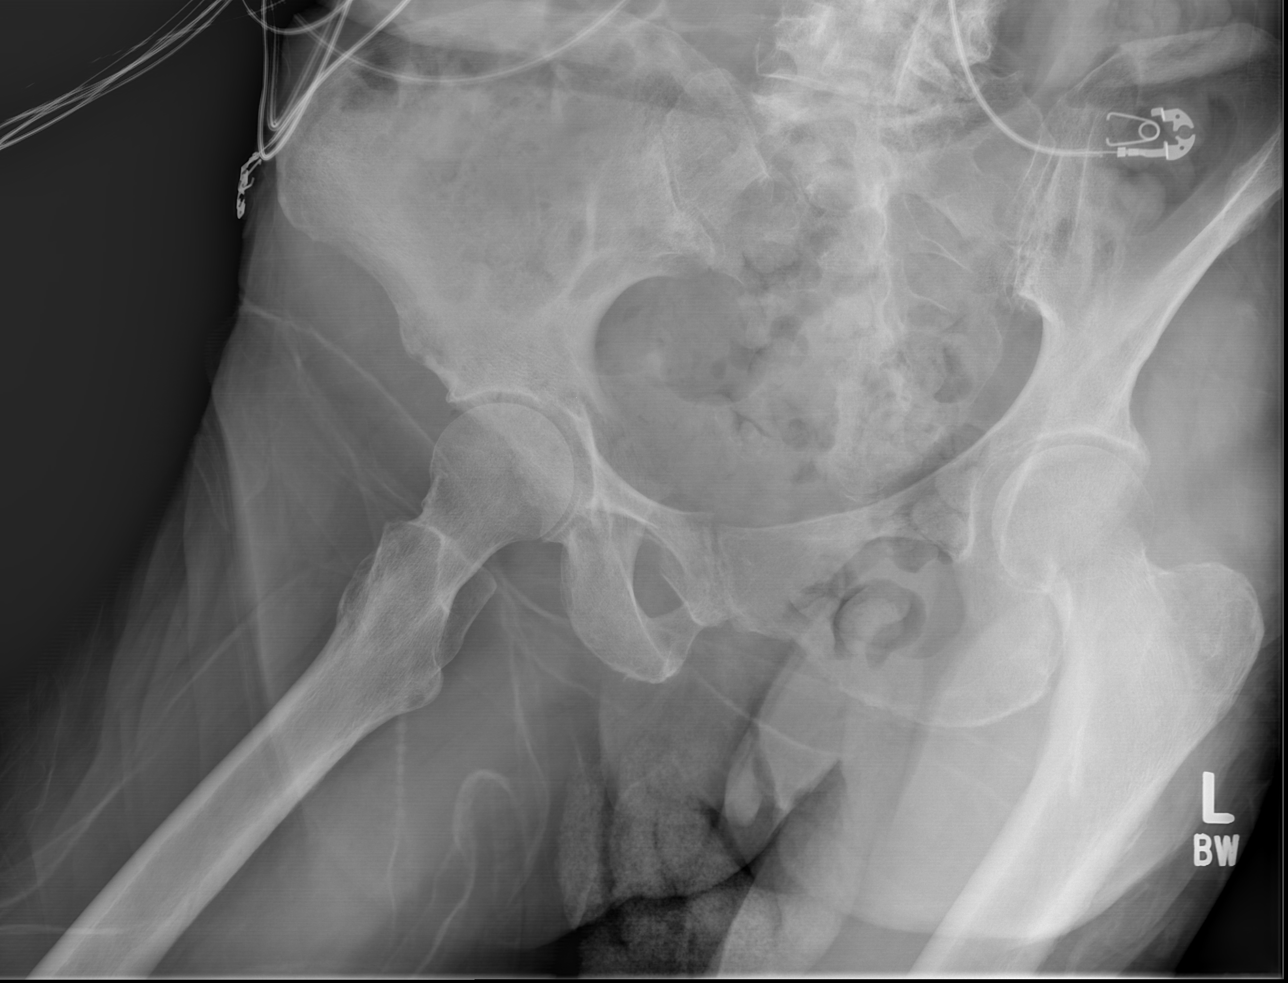

[x hip ap right (1 of 2)]
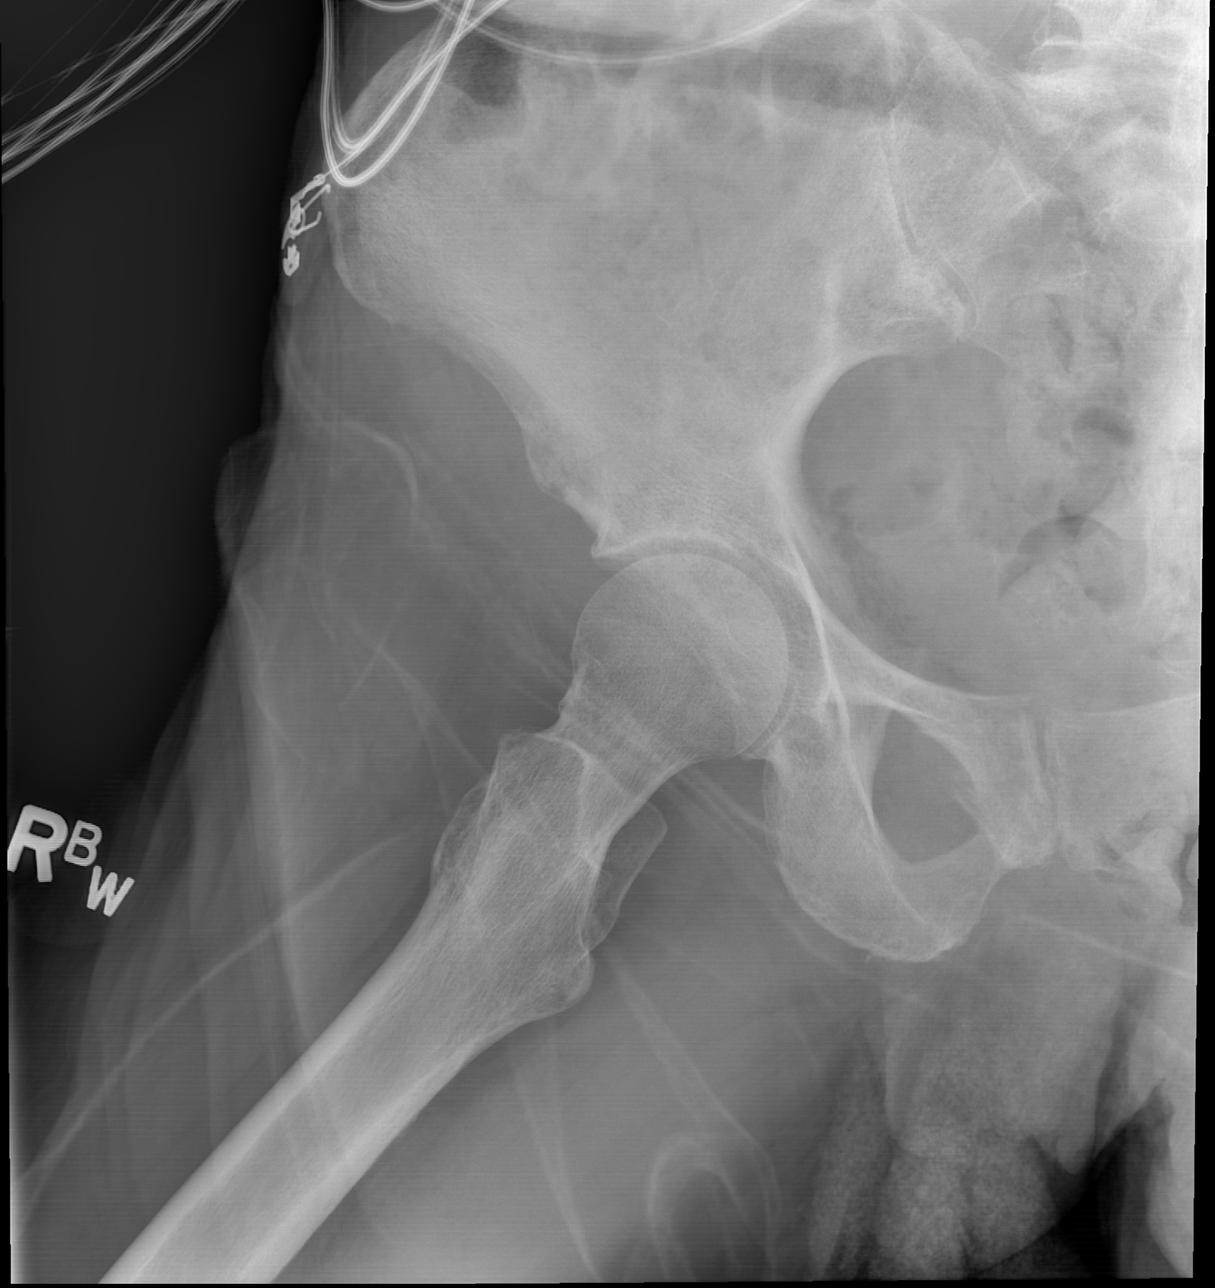

[x hip ap right (2 of 2)]
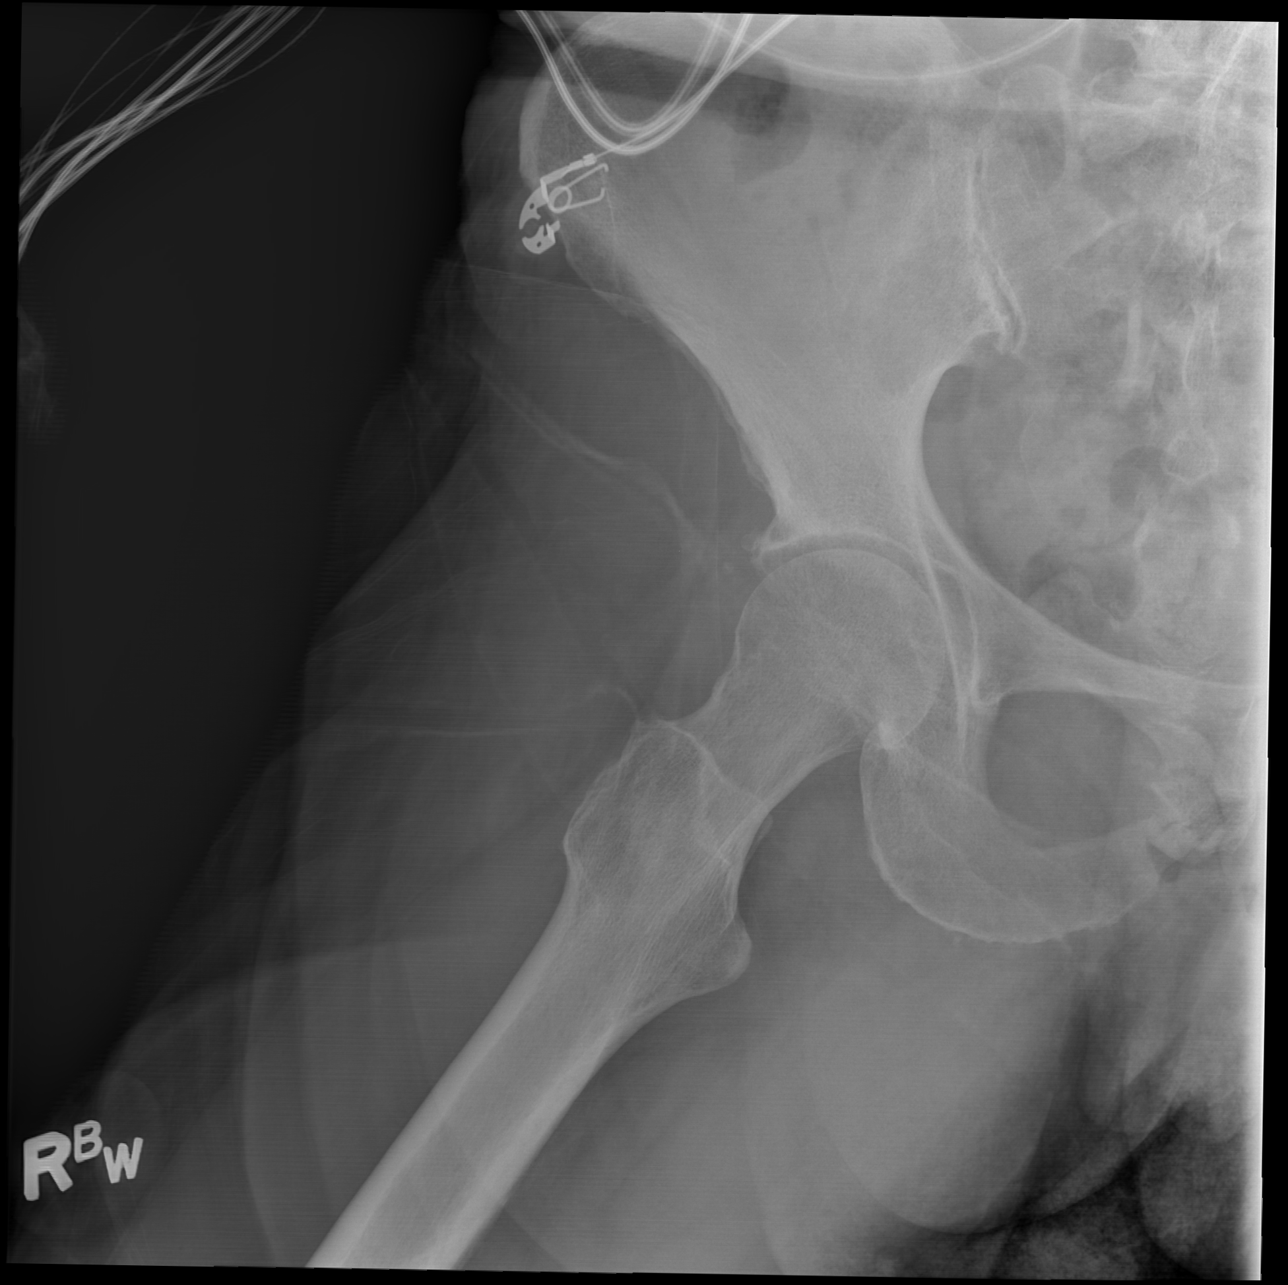

[3 of 3 positions shown; findings below may reference images not displayed]

FINDINGS: Study is limited by atypical projections (patient is consistently
rotated to the right). Despite this limitation, there is apparent
cortical disruption in the parasymphyseal region of the right pubic
bone. Other visualized portions of the pelvis appear intact. Right
proximal femur appears intact, although images demonstrate
essentially the same projection, which limits assessment. Right
femoral head is properly located.
IMPRESSION: 1. Limited study demonstrating what appears to be a parasymphyseal
fracture of the right pubic bone.

## 2016-06-15 IMAGING — CT CT KNEE*R* W/O CM
3 of 4 series · 12 of 33 positions shown, 14 images · non-contrast
Comparison: Radiographs 11/11/2014

CLINICAL DATA: Fell today.  Knee pain and swelling.

EXAM:
CT OF THE right KNEE WITHOUT CONTRAST
TECHNIQUE: Multidetector CT imaging of the right knee was performed according
to the standard protocol. Multiplanar CT image reconstructions were
also generated.

[Series 5: pelvis st · axial · 0.36mm/px · z∈[-294,-129]mm · 4 of 81 slices shown, 5 images]
[im 13/81  soft-tissue]
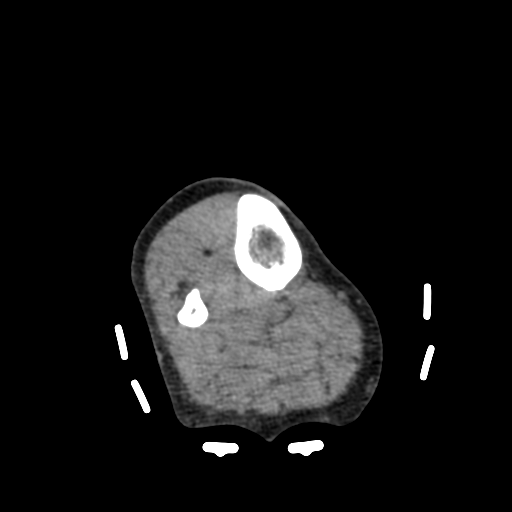
[im 13/81  bone]
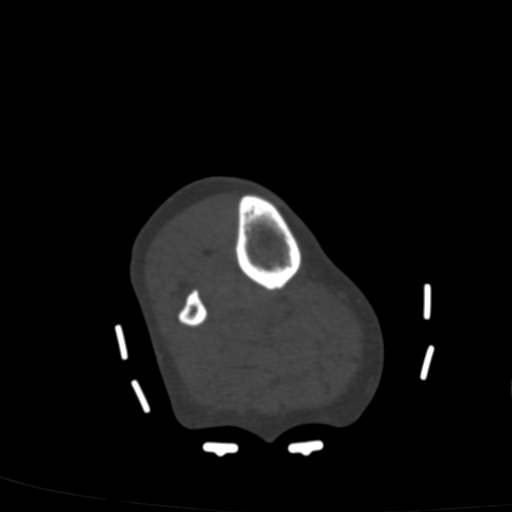
[im 31/81  bone]
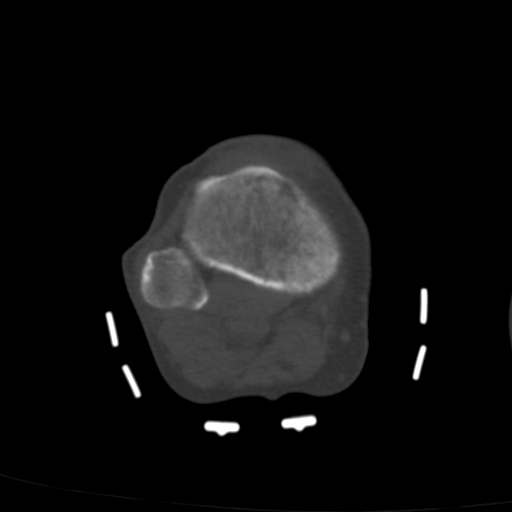
[im 50/81  bone]
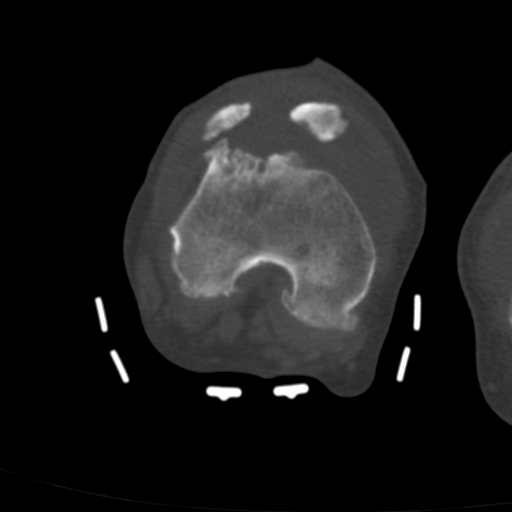
[im 68/81  bone]
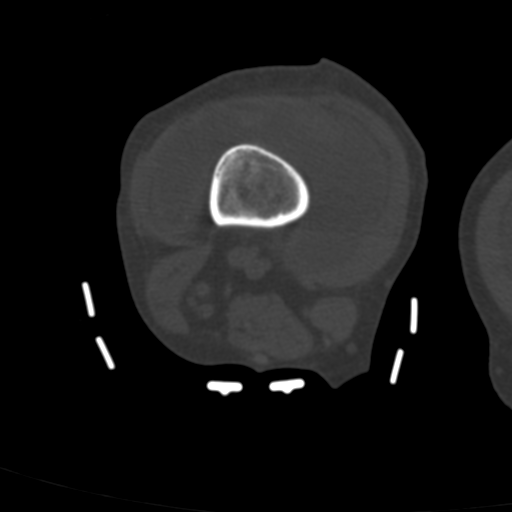

[Series 9: sagittal images · sagittal · 0.32mm/px · 5 of 53 slices shown, 6 images]
[im 9/53  bone]
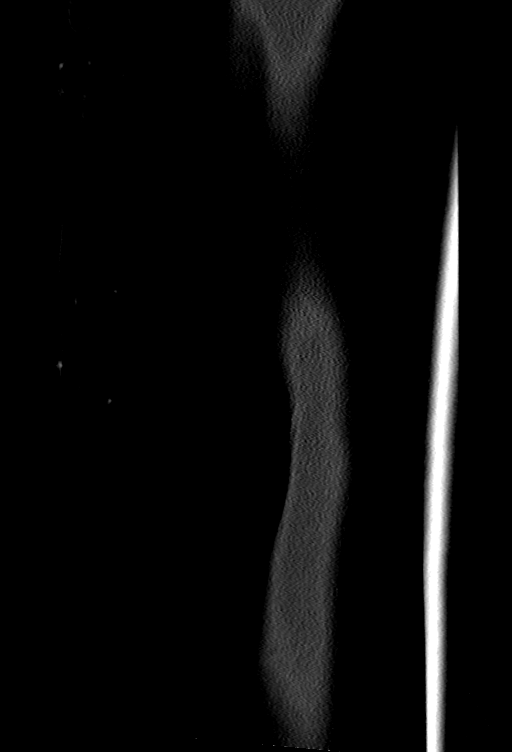
[im 18/53  bone]
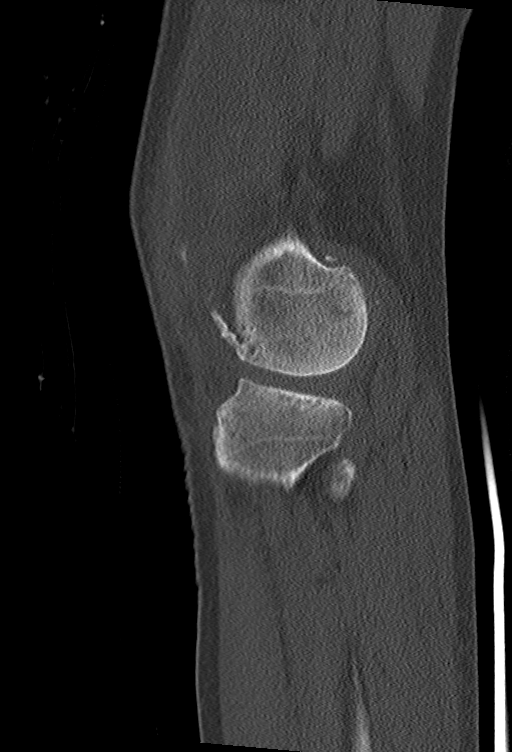
[im 27/53  bone]
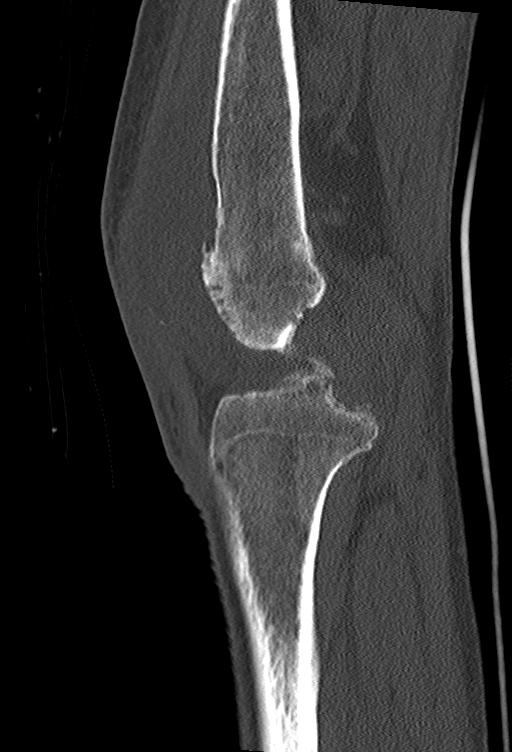
[im 35/53  soft-tissue]
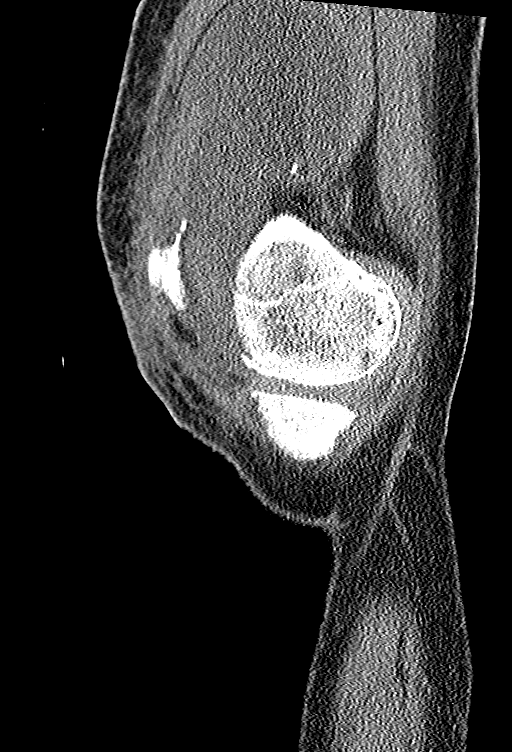
[im 35/53  bone]
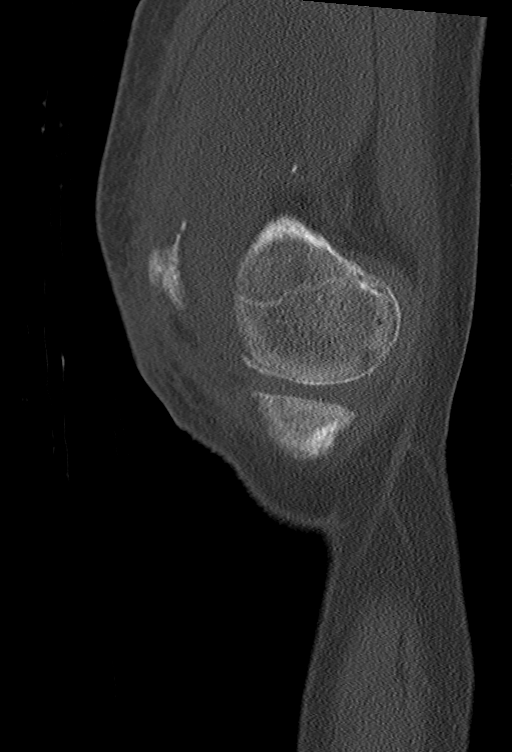
[im 44/53  bone]
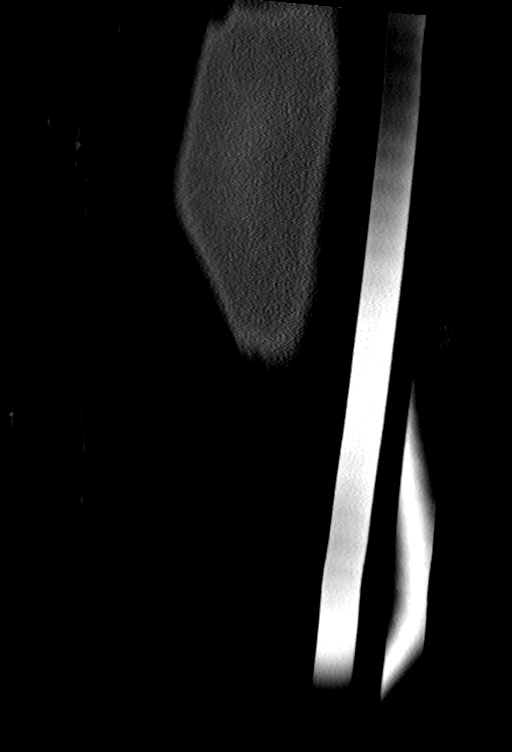

[Series 10: coronal images · coronal · 0.31mm/px · 3 of 63 slices shown]
[im 13/63  bone]
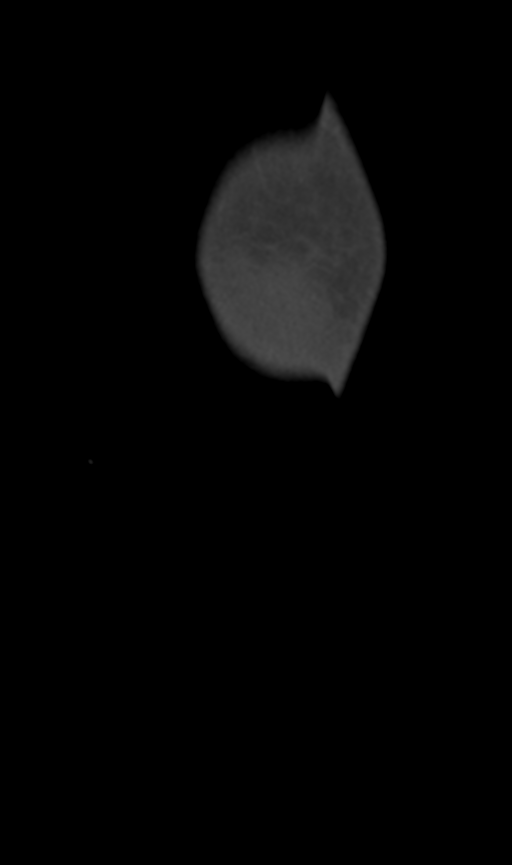
[im 25/63  bone]
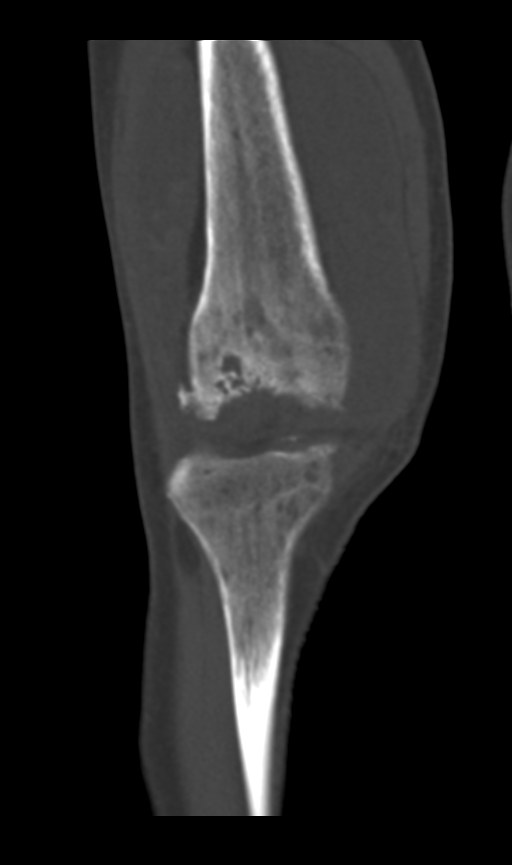
[im 38/63  bone]
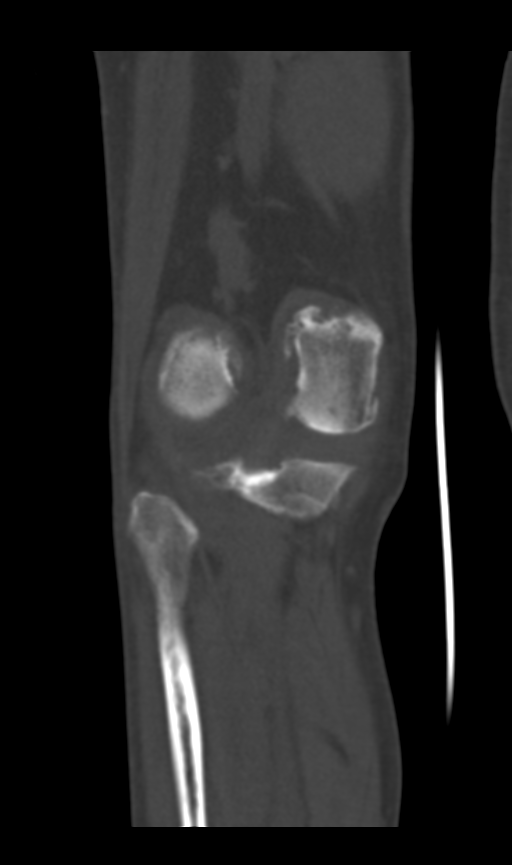

[12 of 33 positions shown; findings below may reference images not displayed]

FINDINGS: The patella is split into 2 parts longitudinally. However, this does
not have the appearance of an acute fracture. It is much more likely
some type of prior surgical procedure with a sharp, corticated
smooth margin. There also appears to be a defect in the quadriceps
tendon. The patella tendon appears to be intact to both halves of
the patella.

Advanced tricompartmental degenerative changes with joint space
narrowing, osteophytic spurring and subchondral cystic change.

Very large joint effusion with small loose bodies. No fracture of
the tibia, femur or fibula is identified. Chondrocalcinosis is
noted.
IMPRESSION: 1. Longitudinally split patella appears to be a remote postsurgical
finding and not an acute fracture.
2. Large suprapatellar knee joint effusion and small loose bodies.
3. Tricompartmental degenerative changes.  No acute fracture.
